# Patient Record
Sex: Female | Born: 1960 | Hispanic: No | Marital: Married | Smoking: Never smoker
Health system: Southern US, Community
[De-identification: ages and names within clinical notes are randomized; demographics above are authoritative.]

## PROBLEM LIST (undated history)

## (undated) DIAGNOSIS — C541 Malignant neoplasm of endometrium: Secondary | ICD-10-CM

## (undated) HISTORY — DX: Malignant neoplasm of endometrium: C54.1

---

## 2006-11-07 ENCOUNTER — Encounter: Admission: RE | Admit: 2006-11-07 | Discharge: 2006-11-07 | Payer: Self-pay | Admitting: Internal Medicine

## 2007-11-29 ENCOUNTER — Encounter: Admission: RE | Admit: 2007-11-29 | Discharge: 2007-11-29 | Payer: Self-pay | Admitting: Internal Medicine

## 2008-11-29 ENCOUNTER — Encounter: Admission: RE | Admit: 2008-11-29 | Discharge: 2008-11-29 | Payer: Self-pay | Admitting: Internal Medicine

## 2008-12-10 ENCOUNTER — Encounter: Admission: RE | Admit: 2008-12-10 | Discharge: 2008-12-10 | Payer: Self-pay | Admitting: Internal Medicine

## 2009-12-15 ENCOUNTER — Encounter: Admission: RE | Admit: 2009-12-15 | Discharge: 2009-12-15 | Payer: Self-pay | Admitting: Internal Medicine

## 2010-07-27 ENCOUNTER — Encounter: Payer: Self-pay | Admitting: Internal Medicine

## 2010-11-05 ENCOUNTER — Other Ambulatory Visit: Payer: Self-pay | Admitting: Internal Medicine

## 2010-11-05 DIAGNOSIS — Z1231 Encounter for screening mammogram for malignant neoplasm of breast: Secondary | ICD-10-CM

## 2010-12-17 ENCOUNTER — Ambulatory Visit
Admission: RE | Admit: 2010-12-17 | Discharge: 2010-12-17 | Disposition: A | Discharge status: Home / Self Care | Payer: 59 | Source: Ambulatory Visit | Attending: Internal Medicine | Admitting: Internal Medicine

## 2010-12-17 DIAGNOSIS — Z1231 Encounter for screening mammogram for malignant neoplasm of breast: Secondary | ICD-10-CM

## 2011-11-01 ENCOUNTER — Other Ambulatory Visit: Payer: Self-pay | Admitting: Internal Medicine

## 2011-11-01 DIAGNOSIS — Z1231 Encounter for screening mammogram for malignant neoplasm of breast: Secondary | ICD-10-CM

## 2011-12-20 ENCOUNTER — Ambulatory Visit
Admission: RE | Admit: 2011-12-20 | Discharge: 2011-12-20 | Disposition: A | Discharge status: Home / Self Care | Payer: 59 | Source: Ambulatory Visit | Attending: Internal Medicine | Admitting: Internal Medicine

## 2011-12-20 DIAGNOSIS — Z1231 Encounter for screening mammogram for malignant neoplasm of breast: Secondary | ICD-10-CM

## 2012-04-28 LAB — HM COLONOSCOPY

## 2012-11-13 ENCOUNTER — Other Ambulatory Visit: Payer: Self-pay

## 2012-11-13 DIAGNOSIS — Z1231 Encounter for screening mammogram for malignant neoplasm of breast: Secondary | ICD-10-CM

## 2012-12-25 ENCOUNTER — Ambulatory Visit
Admission: RE | Admit: 2012-12-25 | Discharge: 2012-12-25 | Disposition: A | Discharge status: Home / Self Care | Payer: Managed Care, Other (non HMO) | Source: Ambulatory Visit

## 2012-12-25 DIAGNOSIS — Z1231 Encounter for screening mammogram for malignant neoplasm of breast: Secondary | ICD-10-CM

## 2013-11-20 ENCOUNTER — Other Ambulatory Visit: Payer: Self-pay

## 2013-11-20 DIAGNOSIS — Z1231 Encounter for screening mammogram for malignant neoplasm of breast: Secondary | ICD-10-CM

## 2013-12-26 ENCOUNTER — Ambulatory Visit
Admission: RE | Admit: 2013-12-26 | Discharge: 2013-12-26 | Disposition: A | Discharge status: Home / Self Care | Payer: Managed Care, Other (non HMO) | Source: Ambulatory Visit

## 2013-12-26 DIAGNOSIS — Z1231 Encounter for screening mammogram for malignant neoplasm of breast: Secondary | ICD-10-CM

## 2014-11-25 ENCOUNTER — Other Ambulatory Visit: Payer: Self-pay

## 2014-11-25 DIAGNOSIS — Z1231 Encounter for screening mammogram for malignant neoplasm of breast: Secondary | ICD-10-CM

## 2014-12-30 ENCOUNTER — Ambulatory Visit
Admission: RE | Admit: 2014-12-30 | Discharge: 2014-12-30 | Disposition: A | Discharge status: Home / Self Care | Payer: BLUE CROSS/BLUE SHIELD | Source: Ambulatory Visit

## 2014-12-30 DIAGNOSIS — Z1231 Encounter for screening mammogram for malignant neoplasm of breast: Secondary | ICD-10-CM

## 2015-11-24 ENCOUNTER — Other Ambulatory Visit: Payer: Self-pay

## 2015-11-24 DIAGNOSIS — Z1231 Encounter for screening mammogram for malignant neoplasm of breast: Secondary | ICD-10-CM

## 2016-01-02 ENCOUNTER — Ambulatory Visit
Admission: RE | Admit: 2016-01-02 | Discharge: 2016-01-02 | Disposition: A | Discharge status: Home / Self Care | Payer: Managed Care, Other (non HMO) | Source: Ambulatory Visit

## 2016-01-02 DIAGNOSIS — Z1231 Encounter for screening mammogram for malignant neoplasm of breast: Secondary | ICD-10-CM

## 2016-05-24 ENCOUNTER — Ambulatory Visit: Payer: Managed Care, Other (non HMO) | Attending: Gynecology | Admitting: Gynecology

## 2016-05-24 ENCOUNTER — Encounter: Payer: Self-pay | Admitting: Gynecology

## 2016-05-24 VITALS — BP 103/64 | HR 66 | Temp 98.3°F | Resp 18 | Ht 59.0 in | Wt 128.4 lb

## 2016-05-24 DIAGNOSIS — C541 Malignant neoplasm of endometrium: Secondary | ICD-10-CM | POA: Diagnosis not present

## 2016-05-24 NOTE — Progress Notes (Signed)
Consult Note: Gyn-Onc   Mallory Hughes 55 y.o. female  Chief Complaint  Patient presents with  . endometrial adenocarcinoma    Assessment :Endometrial adenocarcinoma (grade 1)  Plan: I recommend the patient undergo robotic hysterectomy bilateral salpingo-oophorectomy and possible sentinel node lymphadenectomy. The risks and benefits of this surgery were discussed with the patient and her husband and her daughter (was on the phone) they're aware that postoperative radiation therapy or chemotherapy might be recommended based on our final pathology report. Further decision to perform a lymphadenectomy we based on intraoperative frozen section.  Surgical dates were operative the patient and they've elected to proceed with surgery on 06/15/2016 understand that Dr. Everitt Amber would be the primary surgeon. All questions are answered.  HPI: 55 year old married female seen in consultation at the request of Dr. Butler Denmark regarding management of a newly diagnosed endometrial cancer. The patient presented with postmenopausal bleeding and underwent ultrasound showing a 24 mm endometrial stripe. Endometrial biopsy was obtained on 05/14/2016 revealing an endometrial adenocarcinoma appearing to be well-differentiated (grade 1) the patient denies any pain or any other gynecologic history.  Obstetrical history gravida 1.  The patient has no significant medical comorbidities  Review of Systems:10 point review of systems is negative except as noted in interval history.   Vitals: Blood pressure 103/64, pulse 66, temperature 98.3 F (36.8 C), temperature source Oral, resp. rate 18, height 4\' 11"  (1.499 m), weight 128 lb 6.4 oz (58.2 kg), SpO2 100 %.  Physical Exam: General : The patient is a healthy woman in no acute distress.  HEENT: normocephalic, extraoccular movements normal; neck is supple without thyromegally  Lynphnodes: Supraclavicular and inguinal nodes not enlarged  Abdomen: Soft, non-tender, no  ascites, no organomegally, no masses, no hernias  Pelvic:  EGBUS: Normal female  Vagina: Normal, no lesions  Urethra and Bladder: Normal, non-tender  Cervix: Normal there is no bleeding today. Uterus: Anterior normal shape size and consistency. Bi-manual examination: Non-tender; no adenxal masses or nodularity  Rectal: normal sphincter tone, no masses, no blood  Lower extremities: No edema or varicosities. Normal range of motion      Not on File  History reviewed. No pertinent past medical history.  History reviewed. No pertinent surgical history.  Current Outpatient Prescriptions  Medication Sig Dispense Refill  . calcium-vitamin D 250-100 MG-UNIT tablet Take 1 tablet by mouth 2 (two) times daily.    . Multiple Vitamins-Minerals (MULTIVITAMIN ADULTS PO) Take by mouth.     No current facility-administered medications for this visit.     Social History   Social History  . Marital status: Unknown    Spouse name: N/A  . Number of children: N/A  . Years of education: N/A   Occupational History  . Not on file.   Social History Main Topics  . Smoking status: Never Smoker  . Smokeless tobacco: Never Used  . Alcohol use No  . Drug use: No  . Sexual activity: Yes   Other Topics Concern  . Not on file   Social History Narrative  . No narrative on file    History reviewed. No pertinent family history.    Marti Sleigh, MD 05/24/2016, 1:06 PM

## 2016-05-24 NOTE — Patient Instructions (Signed)
Preparing for your Surgery  Plan for surgery on December 12th with Dr. Everitt Amber  Pre-operative Testing -You will receive a phone call from presurgical testing at [facility] to arrange for a pre-operative testing appointment before your surgery.  This appointment normally occurs one to two weeks before your scheduled surgery.   -Bring your insurance card, copy of an advanced directive if applicable, medication list  -At that visit, you will be asked to sign a consent for a possible blood transfusion in case a transfusion becomes necessary during surgery.  The need for a blood transfusion is rare but having consent is a necessary part of your care.     -You should not be taking blood thinners or aspirin at least ten days prior to surgery unless instructed by your surgeon.  Day Before Surgery at [place] will be asked to take in a light diet the day before surgery.  Avoid carbonated beverages.  You will be advised to have nothing to eat or drink after midnight the evening before.     Eat a light diet the day before surgery.  Examples including soups, broths,  toast, yogurt, mashed potatoes.  Things to avoid include carbonated beverages  (fizzy beverages), raw fruits and raw vegetables, or beans.    If your bowels are filled with gas, your surgeon will have difficulty  visualizing your pelvic organs which increases your surgical risks.  Your role in recovery Your role is to become active as soon as directed by your doctor, while still giving yourself time to heal.  Rest when you feel tired. You will be asked to do the following in order to speed your recovery:  - Cough and breathe deeply. This helps toclear and expand your lungs and can prevent pneumonia. You may be given a spirometer to practice deep breathing. A staff member will show you how to use the spirometer. - Do mild physical activity. Walking or moving your legs help your circulation and body functions return  to normal. A staff member will help you when you try to walk and will provide you with simple exercises. Do not try to get up or walk alone the first time. - Actively manage your pain. Managing your pain lets you move in comfort. We will ask you to rate your pain on a scale of zero to 10. It is your responsibility to tell your doctor or nurse where and how much you hurt so your pain can be treated.  Special Considerations -If you are diabetic, you may be placed on insulin after surgery to have closer control over your blood sugars to promote healing and recovery.  This does not mean that you will be discharged on insulin.  If applicable, your oral antidiabetics will be resumed when you are tolerating a solid diet.  -Your final pathology results from surgery should be available by the Friday after surgery and the results will be relayed to you when available.   Blood Transfusion , Adult A blood transfusion is a procedure in which you receive donated blood, including plasma, platelets, and red blood cells, through an IV tube. You may need a blood transfusion because of illness, surgery, or injury. The blood may come from a donor. You may also be able to donate blood for yourself (autologous blood donation) before a surgery if you know that you might require a blood transfusion. The blood given in a transfusion is made up of different types of cells. You may receive:  Red  blood cells. These carry oxygen to the cells in the body.  White blood cells. These help you fight infections.  Platelets. These help your blood to clot.  Plasma. This is the liquid part of your blood and it helps with fluid imbalances. If you have hemophilia or another clotting disorder, you may also receive other types of blood products. Tell a health care provider about:  Any allergies you have.  All medicines you are taking, including vitamins, herbs, eye drops, creams, and over-the-counter medicines.  Any problems you  or family members have had with anesthetic medicines.  Any blood disorders you have.  Any surgeries you have had.  Any medical conditions you have, including any recent fever or cold symptoms.  Whether you are pregnant or may be pregnant.  Any previous reactions you have had during a blood transfusion. What are the risks? Generally, this is a safe procedure. However, problems may occur, including:  Having an allergic reaction to something in the donated blood. Hives and itching may be symptoms of this type of reaction.  Fever. This may be a reaction to the white blood cells in the transfused blood. Nausea or chest pain may accompany a fever.  Iron overload. This can happen from having many transfusions.  Transfusion-related acute lung injury (TRALI). This is a rare reaction that causes lung damage. The cause is not known.TRALI can occur within hours of a transfusion or several days later.  Sudden (acute) or delayed hemolytic reactions. This happens if your blood does not match the cells in your transfusion. Your body's defense system (immune system) may try to attack the new cells. This complication is rare. The symptoms include fever, chills, nausea, and low back pain or chest pain.  Infection or disease transmission. This is rare. What happens before the procedure?  You will have a blood test to determine your blood type. This is necessary to know what kind of blood your body will accept and to match it to the donor blood.  If you are going to have a planned surgery, you may be able to do an autologous blood donation. This may be done in case you need to have a transfusion.  If you have had an allergic reaction to a transfusion in the past, you may be given medicine to help prevent a reaction. This medicine may be given to you by mouth or through an IV tube.  You will have your temperature, blood pressure, and pulse monitored before the transfusion.  Follow instructions from  your health care provider about eating and drinking restrictions.  Ask your health care provider about:  Changing or stopping your regular medicines. This is especially important if you are taking diabetes medicines or blood thinners.  Taking medicines such as aspirin and ibuprofen. These medicines can thin your blood. Do not take these medicines before your procedure if your health care provider instructs you not to. What happens during the procedure?  An IV tube will be inserted into one of your veins.  The bag of donated blood will be attached to your IV tube. The blood will then enter through your vein.  Your temperature, blood pressure, and pulse will be monitored regularly during the transfusion. This monitoring is done to detect early signs of a transfusion reaction.  If you have any signs or symptoms of a reaction, your transfusion will be stopped and you may be given medicine.  When the transfusion is complete, your IV tube will be removed.  Pressure may  be applied to the IV site for a few minutes.  A bandage (dressing) will be applied. The procedure may vary among health care providers and hospitals. What happens after the procedure?  Your temperature, blood pressure, heart rate, breathing rate, and blood oxygen level will be monitored often.  Your blood may be tested to see how you are responding to the transfusion.  You may be warmed with fluids or blankets to maintain a normal body temperature. Summary  A blood transfusion is a procedure in which you receive donated blood, including plasma, platelets, and red blood cells, through an IV tube.  Your temperature, blood pressure, and pulse will be monitored before, during, and after the transfusion.  Your blood may be tested after the transfusion to see how your body has responded. This information is not intended to replace advice given to you by your health care provider. Make sure you discuss any questions you have  with your health care provider. Document Released: 06/18/2000 Document Revised: 03/18/2016 Document Reviewed: 03/18/2016 Elsevier Interactive Patient Education  2017 [employer].

## 2016-05-25 NOTE — Progress Notes (Signed)
Scheduling pre op appt-  Please PLACE SURGICAL ORDERS IN EPIC  thanks

## 2016-06-08 NOTE — Patient Instructions (Addendum)
Mallory Hughes  06/08/2016   Your procedure is scheduled on: 06-15-16  Report to [facility]  Entrance take [facility]  elevators to 3rd floor to  Mallory Hughes at 1145 AM.  Call this number if you have problems the morning of surgery [phone]   Remember: ONLY 1 PERSON MAY GO WITH YOU TO SHORT STAY TO GET  READY MORNING OF Mallory Hughes.  Do not eat food  :After Midnight. You may have clear liquids from midnight night 7:45 am day of surgery. Then nothing by mouth after 7:45 am.    EAT A LIGHT DIET DAY BEFORE SURGERY 06-14-16 SEE INSTRUCTIONS BELOW   Take these medicines the morning of surgery with A SIP OF WATER: none              You may not have any metal on your body including hair pins and              piercings  Do not wear jewelry, make-up, lotions, powders or perfumes, deodorant             Do not wear nail polish.  Do not shave  48 hours prior to surgery.              Men may shave face and neck.   Do not bring valuables to the hospital. Mallory Hughes.  Contacts, dentures or bridgework may not be worn into surgery.  Leave suitcase in the car. After surgery it may be brought to your room.                  Please read over the following fact sheets you were given: _____________________________________________________________________             Eat a light diet the day before surgery.  Examples including soups, broths, toast, yogurt, mashed potatoes.  Things to avoid include carbonated beverages (fizzy beverages), raw fruits and raw vegetables, or beans.   If your bowels are filled with gas, your surgeon will have difficulty visualizing your pelvic organs which increases your surgical risks.   CLEAR LIQUID DIET ( FROM MIDNIGHT UNTIL 7:45 AM DAY OF SURGERY, THEN NOTHING BY MOUTH AFTER 7:45 AM DAY OF SURGERY)   Foods Allowed                                                                      Foods Excluded  Coffee and tea, regular and decaf                             liquids that you cannot  Plain Jell-O in any flavor                                             see through such as: Fruit ices (not with fruit pulp)  milk, soups, orange juice  Iced Popsicles                                    All solid food Carbonated beverages, regular and diet                                    Cranberry, grape and apple juices Sports drinks like Gatorade Lightly seasoned clear broth or consume(fat free) Sugar, honey syrup  Sample Menu Breakfast                                Lunch                                     Supper Cranberry juice                    Beef broth                            Chicken broth Jell-O                                     Grape juice                           Apple juice Coffee or tea                        Jell-O                                      Popsicle                                                Coffee or tea                        Coffee or tea  _____________________________________________________________________  [facility] - Preparing for Surgery Before surgery, you can play an important role.  Because skin is not sterile, your skin needs to be as free of germs as possible.  You can reduce the number of germs on your skin by washing with CHG (chlorahexidine gluconate) soap before surgery.  CHG is an antiseptic cleaner which kills germs and bonds with the skin to continue killing germs even after washing. Please DO NOT use if you have an allergy to CHG or antibacterial soaps.  If your skin becomes reddened/irritated stop using the CHG and inform your nurse when you arrive at Short Stay. Do not shave (including legs and underarms) for at least 48 hours prior to the first CHG shower.  You may shave your face/neck. Please follow these instructions carefully:  1.  Shower with CHG Soap the night before surgery and the   morning of Surgery.  2.  If you choose to  wash your hair, wash your hair first as usual with your  normal  shampoo.  3.  After you shampoo, rinse your hair and body thoroughly to remove the  shampoo.                           4.  Use CHG as you would any other liquid soap.  You can apply chg directly  to the skin and wash                       Gently with a scrungie or clean washcloth.  5.  Apply the CHG Soap to your body ONLY FROM THE NECK DOWN.   Do not use on face/ open                           Wound or open sores. Avoid contact with eyes, ears mouth and genitals (private parts).                       Wash face,  Genitals (private parts) with your normal soap.             6.  Wash thoroughly, paying special attention to the area where your surgery  will be performed.  7.  Thoroughly rinse your body with warm water from the neck down.  8.  DO NOT shower/wash with your normal soap after using and rinsing off  the CHG Soap.                9.  Pat yourself dry with a clean towel.            10.  Wear clean pajamas.            11.  Place clean sheets on your bed the night of your first shower and do not  sleep with pets. Day of Surgery : Do not apply any lotions/deodorants the morning of surgery.  Please wear clean clothes to the hospital/surgery center.  FAILURE TO FOLLOW THESE INSTRUCTIONS MAY RESULT IN THE CANCELLATION OF YOUR SURGERY PATIENT SIGNATURE_________________________________  NURSE SIGNATURE__________________________________  ________________________________________________________________________   Mallory Hughes  An incentive spirometer is a tool that can help keep your lungs clear and active. This tool measures how well you are filling your lungs with each breath. Taking long deep breaths may help reverse or decrease the chance of developing breathing (pulmonary) problems (especially infection) following:  A long period of time when you are unable to move or be  active. BEFORE THE PROCEDURE   If the spirometer includes an indicator to show your best effort, your nurse or respiratory therapist will set it to a desired goal.  If possible, sit up straight or lean slightly forward. Try not to slouch.  Hold the incentive spirometer in an upright position. INSTRUCTIONS FOR USE  1. Sit on the edge of your bed if possible, or sit up as far as you can in bed or on a chair. 2. Hold the incentive spirometer in an upright position. 3. Breathe out normally. 4. Place the mouthpiece in your mouth and seal your lips tightly around it. 5. Breathe in slowly and as deeply as possible, raising the piston or the ball toward the top of the column. 6. Hold your breath for 3-5 seconds or for as long as possible. Allow the piston or ball to fall to the bottom  of the column. 7. Remove the mouthpiece from your mouth and breathe out normally. 8. Rest for a few seconds and repeat Steps 1 through 7 at least 10 times every 1-2 hours when you are awake. Take your time and take a few normal breaths between deep breaths. 9. The spirometer may include an indicator to show your best effort. Use the indicator as a goal to work toward during each repetition. 10. After each set of 10 deep breaths, practice coughing to be sure your lungs are clear. If you have an incision (the cut made at the time of surgery), support your incision when coughing by placing a pillow or rolled up towels firmly against it. Once you are able to get out of bed, walk around indoors and cough well. You may stop using the incentive spirometer when instructed by your caregiver.  RISKS AND COMPLICATIONS  Take your time so you do not get dizzy or light-headed.  If you are in pain, you may need to take or ask for pain medication before doing incentive spirometry. It is harder to take a deep breath if you are having pain. AFTER USE  Rest and breathe slowly and easily.  It can be helpful to keep track of a log of  your progress. Your caregiver can provide you with a simple table to help with this. If you are using the spirometer at home, follow these instructions: [place] IF:   You are having difficultly using the spirometer.  You have trouble using the spirometer as often as instructed.  Your pain medication is not giving enough relief while using the spirometer.  You develop fever of 100.5 F (38.1 C) or higher. SEEK IMMEDIATE MEDICAL CARE IF:   You cough up bloody sputum that had not been present before.  You develop fever of 102 F (38.9 C) or greater.  You develop worsening pain at or near the incision site. MAKE SURE YOU:   Understand these instructions.  Will watch your condition.  Will get help right away if you are not doing well or get worse. Document Released: 11/01/2006 Document Revised: 09/13/2011 Document Reviewed: 01/02/2007 ExitCare Patient Information 2014 Exit[employer], [place].   ________________________________________________________________________  WHAT IS A BLOOD TRANSFUSION? Blood Transfusion Information  A transfusion is the replacement of blood or some of its parts. Blood is made up of multiple cells which provide different functions.  Red blood cells carry oxygen and are used for blood loss replacement.  White blood cells fight against infection.  Platelets control bleeding.  Plasma helps clot blood.  Other blood products are available for specialized needs, such as hemophilia or other clotting disorders. BEFORE THE TRANSFUSION  Who gives blood for transfusions?   Healthy volunteers who are fully evaluated to make sure their blood is safe. This is blood bank blood. Transfusion therapy is the safest it has ever been in the practice of medicine. Before blood is taken from a donor, a complete history is taken to make sure that person has no history of diseases nor engages in risky social behavior (examples are intravenous drug use or sexual activity  with multiple partners). The donor's travel history is screened to minimize risk of transmitting infections, such as malaria. The donated blood is tested for signs of infectious diseases, such as HIV and hepatitis. The blood is then tested to be sure it is compatible with you in order to minimize the chance of a transfusion reaction. If you or a relative donates blood, this is  often done in anticipation of surgery and is not appropriate for emergency situations. It takes many days to process the donated blood. RISKS AND COMPLICATIONS Although transfusion therapy is very safe and saves many lives, the main dangers of transfusion include:   Getting an infectious disease.  Developing a transfusion reaction. This is an allergic reaction to something in the blood you were given. Every precaution is taken to prevent this. The decision to have a blood transfusion has been considered carefully by your caregiver before blood is given. Blood is not given unless the benefits outweigh the risks. AFTER THE TRANSFUSION  Right after receiving a blood transfusion, you will usually feel much better and more energetic. This is especially true if your red blood cells have gotten low (anemic). The transfusion raises the level of the red blood cells which carry oxygen, and this usually causes an energy increase.  The nurse administering the transfusion will monitor you carefully for complications. HOME CARE INSTRUCTIONS  No special instructions are needed after a transfusion. You may find your energy is better. Speak with your caregiver about any limitations on activity for underlying diseases you may have. SEEK MEDICAL CARE IF:   Your condition is not improving after your transfusion.  You develop redness or irritation at the intravenous (IV) site. SEEK IMMEDIATE MEDICAL CARE IF:  Any of the following symptoms occur over the next 12 hours:  Shaking chills.  You have a temperature by mouth above 102 F (38.9  C), not controlled by medicine.  Chest, back, or muscle pain.  People around you feel you are not acting correctly or are confused.  Shortness of breath or difficulty breathing.  Dizziness and fainting.  You get a rash or develop hives.  You have a decrease in urine output.  Your urine turns a dark color or changes to pink, red, or brown. Any of the following symptoms occur over the next 10 days:  You have a temperature by mouth above 102 F (38.9 C), not controlled by medicine.  Shortness of breath.  Weakness after normal activity.  The white part of the eye turns yellow (jaundice).  You have a decrease in the amount of urine or are urinating less often.  Your urine turns a dark color or changes to pink, red, or brown. Document Released: 06/18/2000 Document Revised: 09/13/2011 Document Reviewed: 02/05/2008 [facility] Patient Information 2014 [place], [place].  _______________________________________________________________________

## 2016-06-11 ENCOUNTER — Ambulatory Visit (HOSPITAL_COMMUNITY)
Admission: RE | Admit: 2016-06-11 | Discharge: 2016-06-11 | Disposition: A | Discharge status: Home / Self Care | Payer: Managed Care, Other (non HMO) | Source: Ambulatory Visit | Attending: Gynecologic Oncology | Admitting: Gynecologic Oncology

## 2016-06-11 ENCOUNTER — Encounter (HOSPITAL_COMMUNITY): Payer: Self-pay

## 2016-06-11 ENCOUNTER — Encounter (HOSPITAL_COMMUNITY)
Admission: RE | Admit: 2016-06-11 | Discharge: 2016-06-11 | Disposition: A | Discharge status: Home / Self Care | Payer: Managed Care, Other (non HMO) | Source: Ambulatory Visit | Attending: Gynecologic Oncology | Admitting: Gynecologic Oncology

## 2016-06-11 DIAGNOSIS — C541 Malignant neoplasm of endometrium: Secondary | ICD-10-CM | POA: Diagnosis present

## 2016-06-11 DIAGNOSIS — Z01812 Encounter for preprocedural laboratory examination: Secondary | ICD-10-CM | POA: Diagnosis present

## 2016-06-11 DIAGNOSIS — Z01818 Encounter for other preprocedural examination: Secondary | ICD-10-CM | POA: Insufficient documentation

## 2016-06-11 LAB — HCG, SERUM, QUALITATIVE: Preg, Serum: NEGATIVE

## 2016-06-11 LAB — COMPREHENSIVE METABOLIC PANEL
ALT: 16 U/L (ref 14–54)
ANION GAP: 4 — AB (ref 5–15)
AST: 21 U/L (ref 15–41)
Albumin: 4.5 g/dL (ref 3.5–5.0)
Alkaline Phosphatase: 65 U/L (ref 38–126)
BUN: 6 mg/dL (ref 6–20)
CHLORIDE: 105 mmol/L (ref 101–111)
CO2: 29 mmol/L (ref 22–32)
CREATININE: 0.62 mg/dL (ref 0.44–1.00)
Calcium: 9.5 mg/dL (ref 8.9–10.3)
GFR calc non Af Amer: >60 mL/min (ref >60)
Glucose, Bld: 120 mg/dL — ABNORMAL HIGH (ref 65–99)
Potassium: 3.7 mmol/L (ref 3.5–5.1)
SODIUM: 138 mmol/L (ref 135–145)
Total Bilirubin: 0.5 mg/dL (ref 0.3–1.2)
Total Protein: 7.8 g/dL (ref 6.5–8.1)

## 2016-06-11 LAB — CBC WITH DIFFERENTIAL/PLATELET
BASOS PCT: 1 %
Basophils Absolute: 0 10*3/uL (ref 0.0–0.1)
EOS ABS: 0.4 10*3/uL (ref 0.0–0.7)
EOS PCT: 6 %
HCT: 37 % (ref 36.0–46.0)
Hemoglobin: 13.2 g/dL (ref 12.0–15.0)
LYMPHS ABS: 2.4 10*3/uL (ref 0.7–4.0)
Lymphocytes Relative: 37 %
MCH: 30.2 pg (ref 26.0–34.0)
MCHC: 35.7 g/dL (ref 30.0–36.0)
MCV: 84.7 fL (ref 78.0–100.0)
Monocytes Absolute: 0.4 10*3/uL (ref 0.1–1.0)
Monocytes Relative: 6 %
Neutro Abs: 3.4 10*3/uL (ref 1.7–7.7)
Neutrophils Relative %: 52 %
PLATELETS: 226 10*3/uL (ref 150–400)
RBC: 4.37 MIL/uL (ref 3.87–5.11)
RDW: 12.3 % (ref 11.5–15.5)
WBC: 6.6 10*3/uL (ref 4.0–10.5)

## 2016-06-11 LAB — URINALYSIS, ROUTINE W REFLEX MICROSCOPIC
BILIRUBIN URINE: NEGATIVE
Bacteria, UA: NONE SEEN
Glucose, UA: NEGATIVE mg/dL
KETONES UR: NEGATIVE mg/dL
NITRITE: NEGATIVE
PROTEIN: NEGATIVE mg/dL
RBC / HPF: NONE SEEN RBC/hpf (ref 0–5)
SPECIFIC GRAVITY, URINE: 1.002 — AB (ref 1.005–1.030)
Squamous Epithelial / LPF: NONE SEEN
pH: 7 (ref 5.0–8.0)

## 2016-06-12 LAB — ABO/RH: ABO/RH(D): B POS

## 2016-06-15 ENCOUNTER — Ambulatory Visit (HOSPITAL_COMMUNITY)
Admission: RE | Admit: 2016-06-15 | Discharge: 2016-06-16 | Disposition: A | Discharge status: Home / Self Care | Payer: Managed Care, Other (non HMO) | Source: Ambulatory Visit | Attending: Gynecologic Oncology | Admitting: Gynecologic Oncology

## 2016-06-15 ENCOUNTER — Encounter (HOSPITAL_COMMUNITY)
Admission: RE | Disposition: A | Discharge status: Home / Self Care | Payer: Self-pay | Source: Ambulatory Visit | Attending: Gynecologic Oncology

## 2016-06-15 ENCOUNTER — Ambulatory Visit (HOSPITAL_COMMUNITY): Payer: Managed Care, Other (non HMO) | Admitting: Anesthesiology

## 2016-06-15 ENCOUNTER — Encounter (HOSPITAL_COMMUNITY): Payer: Self-pay | Admitting: *Deleted

## 2016-06-15 DIAGNOSIS — D251 Intramural leiomyoma of uterus: Secondary | ICD-10-CM | POA: Diagnosis not present

## 2016-06-15 DIAGNOSIS — C541 Malignant neoplasm of endometrium: Secondary | ICD-10-CM | POA: Diagnosis present

## 2016-06-15 DIAGNOSIS — N888 Other specified noninflammatory disorders of cervix uteri: Secondary | ICD-10-CM | POA: Insufficient documentation

## 2016-06-15 DIAGNOSIS — N7091 Salpingitis, unspecified: Secondary | ICD-10-CM | POA: Diagnosis not present

## 2016-06-15 HISTORY — PX: LYMPH NODE DISSECTION: SHX5087

## 2016-06-15 HISTORY — PX: ROBOTIC ASSISTED SALPINGO OOPHERECTOMY: SHX6082

## 2016-06-15 HISTORY — PX: ROBOTIC ASSISTED LAP VAGINAL HYSTERECTOMY: SHX2362

## 2016-06-15 LAB — TYPE AND SCREEN
ABO/RH(D): B POS
Antibody Screen: NEGATIVE

## 2016-06-15 SURGERY — HYSTERECTOMY, VAGINAL, ROBOT-ASSISTED
Anesthesia: General | Site: Abdomen

## 2016-06-15 MED ORDER — ENOXAPARIN SODIUM 40 MG/0.4ML ~~LOC~~ SOLN
40.0000 mg | SUBCUTANEOUS | Status: DC
  Administered 2016-06-16: 40 mg via SUBCUTANEOUS
  Filled 2016-06-15: qty 0.4

## 2016-06-15 MED ORDER — LACTATED RINGERS IV SOLN: INTRAVENOUS | Status: DC

## 2016-06-15 MED ORDER — FENTANYL CITRATE (PF) 250 MCG/5ML IJ SOLN
INTRAMUSCULAR | Status: AC
  Filled 2016-06-15: qty 5

## 2016-06-15 MED ORDER — SUGAMMADEX SODIUM 200 MG/2ML IV SOLN
INTRAVENOUS | Status: DC | PRN
  Administered 2016-06-15: 200 mg via INTRAVENOUS

## 2016-06-15 MED ORDER — LACTATED RINGERS IR SOLN
Status: DC | PRN
  Administered 2016-06-15: 3000 mL

## 2016-06-15 MED ORDER — MEPERIDINE HCL 50 MG/ML IJ SOLN: 6.2500 mg | INTRAMUSCULAR | Status: DC | PRN

## 2016-06-15 MED ORDER — HYDROMORPHONE HCL 2 MG/ML IJ SOLN
INTRAMUSCULAR | Status: AC
  Filled 2016-06-15: qty 1

## 2016-06-15 MED ORDER — CEFAZOLIN SODIUM-DEXTROSE 2-4 GM/100ML-% IV SOLN
2.0000 g | INTRAVENOUS | Status: AC
  Administered 2016-06-15: 2 g via INTRAVENOUS

## 2016-06-15 MED ORDER — ONDANSETRON HCL 4 MG/2ML IJ SOLN
INTRAMUSCULAR | Status: AC
  Filled 2016-06-15: qty 2

## 2016-06-15 MED ORDER — LIDOCAINE 2% (20 MG/ML) 5 ML SYRINGE
INTRAMUSCULAR | Status: DC | PRN
  Administered 2016-06-15: 100 mg via INTRAVENOUS

## 2016-06-15 MED ORDER — KETOROLAC TROMETHAMINE 15 MG/ML IJ SOLN
INTRAMUSCULAR | Status: AC
  Administered 2016-06-15: 15 mg via INTRAVENOUS
  Filled 2016-06-15: qty 1

## 2016-06-15 MED ORDER — SUGAMMADEX SODIUM 200 MG/2ML IV SOLN
INTRAVENOUS | Status: AC
  Filled 2016-06-15: qty 2

## 2016-06-15 MED ORDER — HYDROMORPHONE HCL 2 MG/ML IJ SOLN
0.2500 mg | INTRAMUSCULAR | Status: DC | PRN
  Administered 2016-06-15: 0.5 mg via INTRAVENOUS

## 2016-06-15 MED ORDER — DEXAMETHASONE SODIUM PHOSPHATE 10 MG/ML IJ SOLN
INTRAMUSCULAR | Status: AC
  Filled 2016-06-15: qty 1

## 2016-06-15 MED ORDER — SUCCINYLCHOLINE CHLORIDE 200 MG/10ML IV SOSY
PREFILLED_SYRINGE | INTRAVENOUS | Status: DC | PRN
  Administered 2016-06-15: 100 mg via INTRAVENOUS

## 2016-06-15 MED ORDER — MIDAZOLAM HCL 5 MG/5ML IJ SOLN
INTRAMUSCULAR | Status: DC | PRN
  Administered 2016-06-15: 2 mg via INTRAVENOUS

## 2016-06-15 MED ORDER — PROPOFOL 10 MG/ML IV BOLUS
INTRAVENOUS | Status: AC
  Filled 2016-06-15: qty 20

## 2016-06-15 MED ORDER — GABAPENTIN 300 MG PO CAPS
600.0000 mg | ORAL_CAPSULE | Freq: Every day | ORAL | Status: AC
  Administered 2016-06-15: 600 mg via ORAL
  Filled 2016-06-15: qty 2

## 2016-06-15 MED ORDER — OXYCODONE-ACETAMINOPHEN 5-325 MG PO TABS
1.0000 | ORAL_TABLET | ORAL | Status: DC | PRN
  Administered 2016-06-15 – 2016-06-16 (×2): 1 via ORAL
  Filled 2016-06-15 (×2): qty 1

## 2016-06-15 MED ORDER — ONDANSETRON HCL 4 MG/2ML IJ SOLN: 4.0000 mg | Freq: Four times a day (QID) | INTRAMUSCULAR | Status: DC | PRN

## 2016-06-15 MED ORDER — FENTANYL CITRATE (PF) 100 MCG/2ML IJ SOLN
INTRAMUSCULAR | Status: DC | PRN
  Administered 2016-06-15 (×3): 50 ug via INTRAVENOUS
  Administered 2016-06-15: 100 ug via INTRAVENOUS

## 2016-06-15 MED ORDER — PHENYLEPHRINE HCL 10 MG/ML IJ SOLN
INTRAMUSCULAR | Status: DC | PRN
  Administered 2016-06-15: 80 ug via INTRAVENOUS

## 2016-06-15 MED ORDER — CEFAZOLIN SODIUM-DEXTROSE 2-4 GM/100ML-% IV SOLN
INTRAVENOUS | Status: AC
  Filled 2016-06-15: qty 100

## 2016-06-15 MED ORDER — ONDANSETRON HCL 4 MG/2ML IJ SOLN
INTRAMUSCULAR | Status: DC | PRN
  Administered 2016-06-15: 4 mg via INTRAVENOUS

## 2016-06-15 MED ORDER — KCL IN DEXTROSE-NACL 20-5-0.45 MEQ/L-%-% IV SOLN
INTRAVENOUS | Status: DC
  Administered 2016-06-15 – 2016-06-16 (×2): via INTRAVENOUS
  Filled 2016-06-15 (×3): qty 1000

## 2016-06-15 MED ORDER — LACTATED RINGERS IV SOLN
INTRAVENOUS | Status: DC
  Administered 2016-06-15: 17:00:00 via INTRAVENOUS
  Administered 2016-06-15: 1000 mL via INTRAVENOUS

## 2016-06-15 MED ORDER — ENOXAPARIN SODIUM 40 MG/0.4ML ~~LOC~~ SOLN
40.0000 mg | SUBCUTANEOUS | Status: AC
  Administered 2016-06-15: 40 mg via SUBCUTANEOUS
  Filled 2016-06-15: qty 0.4

## 2016-06-15 MED ORDER — MIDAZOLAM HCL 2 MG/2ML IJ SOLN
INTRAMUSCULAR | Status: AC
  Filled 2016-06-15: qty 2

## 2016-06-15 MED ORDER — DEXAMETHASONE SODIUM PHOSPHATE 10 MG/ML IJ SOLN
INTRAMUSCULAR | Status: DC | PRN
Start: 2016-06-15 — End: 2016-06-15
  Administered 2016-06-15: 10 mg via INTRAVENOUS

## 2016-06-15 MED ORDER — PROMETHAZINE HCL 25 MG/ML IJ SOLN: 6.2500 mg | INTRAMUSCULAR | Status: DC | PRN

## 2016-06-15 MED ORDER — ONDANSETRON HCL 4 MG PO TABS
4.0000 mg | ORAL_TABLET | Freq: Four times a day (QID) | ORAL | Status: DC | PRN
  Administered 2016-06-16: 4 mg via ORAL
  Filled 2016-06-15: qty 1

## 2016-06-15 MED ORDER — PROPOFOL 10 MG/ML IV BOLUS
INTRAVENOUS | Status: DC | PRN
  Administered 2016-06-15: 100 mg via INTRAVENOUS

## 2016-06-15 MED ORDER — KETOROLAC TROMETHAMINE 15 MG/ML IJ SOLN
15.0000 mg | Freq: Four times a day (QID) | INTRAMUSCULAR | Status: DC
  Administered 2016-06-15 – 2016-06-16 (×2): 15 mg via INTRAVENOUS
  Filled 2016-06-15 (×2): qty 1

## 2016-06-15 MED ORDER — HYDROMORPHONE HCL 2 MG/ML IJ SOLN: 0.2000 mg | INTRAMUSCULAR | Status: DC | PRN

## 2016-06-15 MED ORDER — ROCURONIUM BROMIDE 10 MG/ML (PF) SYRINGE
PREFILLED_SYRINGE | INTRAVENOUS | Status: DC | PRN
  Administered 2016-06-15: 40 mg via INTRAVENOUS

## 2016-06-15 MED ORDER — IBUPROFEN 800 MG PO TABS: 800.0000 mg | ORAL_TABLET | Freq: Three times a day (TID) | ORAL | Status: DC | PRN

## 2016-06-15 SURGICAL SUPPLY — 65 items
ADH SKN CLS APL DERMABOND .7 (GAUZE/BANDAGES/DRESSINGS) ×2
APL ESCP 34 STRL LF DISP (HEMOSTASIS)
APPLICATOR SURGIFLO ENDO (HEMOSTASIS) IMPLANT
BAG LAPAROSCOPIC 12 15 PORT 16 (BASKET) IMPLANT
BAG RETRIEVAL 12/15 (BASKET)
BAG SPEC RTRVL LRG 6X4 10 (ENDOMECHANICALS)
CHLORAPREP W/TINT 26ML (MISCELLANEOUS) ×3 IMPLANT
COVER SURGICAL LIGHT HANDLE (MISCELLANEOUS) ×3 IMPLANT
COVER TIP SHEARS 8 DVNC (MISCELLANEOUS) ×2 IMPLANT
COVER TIP SHEARS 8MM DA VINCI (MISCELLANEOUS) ×1
DERMABOND ADVANCED (GAUZE/BANDAGES/DRESSINGS) ×1
DERMABOND ADVANCED .7 DNX12 (GAUZE/BANDAGES/DRESSINGS) ×2 IMPLANT
DRAPE ARM DVNC X/XI (DISPOSABLE) ×8 IMPLANT
DRAPE COLUMN DVNC XI (DISPOSABLE) ×2 IMPLANT
DRAPE DA VINCI XI ARM (DISPOSABLE) ×4
DRAPE DA VINCI XI COLUMN (DISPOSABLE) ×1
DRAPE SHEET LG 3/4 BI-LAMINATE (DRAPES) ×6 IMPLANT
DRAPE SURG IRRIG POUCH 19X23 (DRAPES) ×3 IMPLANT
ELECT REM PT RETURN 15FT ADLT (MISCELLANEOUS) ×3 IMPLANT
ELECT REM PT RETURN 9FT ADLT (ELECTROSURGICAL) ×3
ELECTRODE REM PT RTRN 9FT ADLT (ELECTROSURGICAL) ×2 IMPLANT
GLOVE BIO SURGEON STRL SZ 6 (GLOVE) ×12 IMPLANT
GLOVE BIO SURGEON STRL SZ 6.5 (GLOVE) ×6 IMPLANT
GOWN STRL REUS W/ TWL LRG LVL3 (GOWN DISPOSABLE) ×6 IMPLANT
GOWN STRL REUS W/TWL LRG LVL3 (GOWN DISPOSABLE) ×9
HOLDER FOLEY CATH W/STRAP (MISCELLANEOUS) ×3 IMPLANT
IRRIG SUCT STRYKERFLOW 2 WTIP (MISCELLANEOUS) ×3
IRRIGATION SUCT STRKRFLW 2 WTP (MISCELLANEOUS) ×2 IMPLANT
KIT BASIN OR (CUSTOM PROCEDURE TRAY) ×3 IMPLANT
KIT PROCEDURE DA VINCI SI (MISCELLANEOUS) ×1
KIT PROCEDURE DVNC SI (MISCELLANEOUS) IMPLANT
MANIPULATOR UTERINE 4.5 ZUMI (MISCELLANEOUS) ×3 IMPLANT
MARKER SKIN DUAL TIP RULER LAB (MISCELLANEOUS) ×3 IMPLANT
NDL SAFETY ECLIPSE 18X1.5 (NEEDLE) ×2 IMPLANT
NDL SPNL 18GX3.5 QUINCKE PK (NEEDLE) ×2 IMPLANT
NEEDLE HYPO 18GX1.5 SHARP (NEEDLE) ×3
NEEDLE SPNL 18GX3.5 QUINCKE PK (NEEDLE) ×3 IMPLANT
OBTURATOR OPTICAL STANDARD 8MM (TROCAR) ×1
OBTURATOR OPTICAL STND 8 DVNC (TROCAR) ×2
OBTURATOR OPTICALSTD 8 DVNC (TROCAR) ×2 IMPLANT
OCCLUDER COLPOPNEUMO (BALLOONS) ×3 IMPLANT
PAD POSITIONING PINK XL (MISCELLANEOUS) ×3 IMPLANT
PORT ACCESS TROCAR AIRSEAL 12 (TROCAR) ×2 IMPLANT
PORT ACCESS TROCAR AIRSEAL 5M (TROCAR) ×1
POUCH SPECIMEN RETRIEVAL 10MM (ENDOMECHANICALS) IMPLANT
SEAL CANN UNIV 5-8 DVNC XI (MISCELLANEOUS) ×8 IMPLANT
SEAL XI 5MM-8MM UNIVERSAL (MISCELLANEOUS) ×4
SET TRI-LUMEN FLTR TB AIRSEAL (TUBING) ×3 IMPLANT
SHEET LAVH (DRAPES) ×3 IMPLANT
SOLUTION ELECTROLUBE (MISCELLANEOUS) ×3 IMPLANT
SURGIFLO W/THROMBIN 8M KIT (HEMOSTASIS) IMPLANT
SUT MNCRL AB 4-0 PS2 18 (SUTURE) ×6 IMPLANT
SUT VIC AB 0 CT1 27 (SUTURE) ×6
SUT VIC AB 0 CT1 27XBRD ANTBC (SUTURE) ×2 IMPLANT
SYR 10ML LL (SYRINGE) ×3 IMPLANT
SYR 50ML LL SCALE MARK (SYRINGE) ×3 IMPLANT
TOWEL OR 17X26 10 PK STRL BLUE (TOWEL DISPOSABLE) ×6 IMPLANT
TOWEL OR NON WOVEN STRL DISP B (DISPOSABLE) ×3 IMPLANT
TRAP SPECIMEN MUCOUS 40CC (MISCELLANEOUS) IMPLANT
TRAY FOLEY W/METER SILVER 16FR (SET/KITS/TRAYS/PACK) ×3 IMPLANT
TRAY LAPAROSCOPIC (CUSTOM PROCEDURE TRAY) ×3 IMPLANT
TROCAR BLADELESS OPT 5 100 (ENDOMECHANICALS) ×3 IMPLANT
UNDERPAD 30X30 (UNDERPADS AND DIAPERS) ×3 IMPLANT
UNDERPAD 30X30 INCONTINENT (UNDERPADS AND DIAPERS) ×3 IMPLANT
WATER STERILE IRR 1500ML POUR (IV SOLUTION) ×6 IMPLANT

## 2016-06-15 NOTE — Anesthesia Postprocedure Evaluation (Signed)
Anesthesia Post Note  Patient: Mallory Hughes  Procedure(s) Performed: Procedure(s) (LRB): XI ROBOTIC ASSISTED LAPAROSCOPIC VAGINAL HYSTERECTOMY (N/A) XI ROBOTIC ASSISTED BILATERAL SALPINGO OOPHORECTOMY (Bilateral) BILATERAL SENTINAL LYMPH NODE DISSECTION (N/A)  Patient location during evaluation: PACU Anesthesia Type: General Level of consciousness: awake and alert, oriented and patient cooperative Pain management: pain level controlled Vital Signs Assessment: post-procedure vital signs reviewed and stable Respiratory status: spontaneous breathing, nonlabored ventilation, respiratory function stable and patient connected to nasal cannula oxygen Cardiovascular status: blood pressure returned to baseline and stable Postop Assessment: no signs of nausea or vomiting Anesthetic complications: no    Last Vitals:  Vitals:   06/15/16 1830 06/15/16 1844  BP: (!) 102/51 (!) 109/51  Pulse: 70 65  Resp: 15 14  Temp: 36.5 C 36.6 C    Last Pain:  Vitals:   06/15/16 1830  TempSrc:   PainSc: Asleep                 Banita Lehn,E. Jayana Kotula

## 2016-06-15 NOTE — Anesthesia Procedure Notes (Signed)
Procedure Name: Intubation Date/Time: 06/15/2016 3:56 PM Performed by: Lind Covert Pre-anesthesia Checklist: Patient identified, Emergency Drugs available, Suction available, Patient being monitored and Timeout performed Patient Re-evaluated:Patient Re-evaluated prior to inductionOxygen Delivery Method: Circle system utilized Preoxygenation: Pre-oxygenation with 100% oxygen Intubation Type: IV induction Laryngoscope Size: Mac and 3 Grade View: Grade I Tube type: Oral Tube size: 7.0 mm Airway Equipment and Method: Stylet Placement Confirmation: ETT inserted through vocal cords under direct vision,  positive ETCO2 and breath sounds checked- equal and bilateral Secured at: 19 cm Tube secured with: Tape Dental Injury: Teeth and Oropharynx as per pre-operative assessment

## 2016-06-15 NOTE — Progress Notes (Signed)
OR delay spoke with family.

## 2016-06-15 NOTE — H&P (View-Only) (Signed)
Consult Note: Gyn-Onc   Mallory Hughes 55 y.o. female  Chief Complaint  Patient presents with  . endometrial adenocarcinoma    Assessment :Endometrial adenocarcinoma (grade 1)  Plan: I recommend the patient undergo robotic hysterectomy bilateral salpingo-oophorectomy and possible sentinel node lymphadenectomy. The risks and benefits of this surgery were discussed with the patient and her husband and her daughter (was on the phone) they're aware that postoperative radiation therapy or chemotherapy might be recommended based on our final pathology report. Further decision to perform a lymphadenectomy we based on intraoperative frozen section.  Surgical dates were operative the patient and they've elected to proceed with surgery on 06/15/2016 understand that Dr. Everitt Amber would be the primary surgeon. All questions are answered.  HPI: 55 year old married female seen in consultation at the request of Dr. Butler Denmark regarding management of a newly diagnosed endometrial cancer. The patient presented with postmenopausal bleeding and underwent ultrasound showing a 24 mm endometrial stripe. Endometrial biopsy was obtained on 05/14/2016 revealing an endometrial adenocarcinoma appearing to be well-differentiated (grade 1) the patient denies any pain or any other gynecologic history.  Obstetrical history gravida 1.  The patient has no significant medical comorbidities  Review of Systems:10 point review of systems is negative except as noted in interval history.   Vitals: Blood pressure 103/64, pulse 66, temperature 98.3 F (36.8 C), temperature source Oral, resp. rate 18, height 4\' 11"  (1.499 m), weight 128 lb 6.4 oz (58.2 kg), SpO2 100 %.  Physical Exam: General : The patient is a healthy woman in no acute distress.  HEENT: normocephalic, extraoccular movements normal; neck is supple without thyromegally  Lynphnodes: Supraclavicular and inguinal nodes not enlarged  Abdomen: Soft, non-tender, no  ascites, no organomegally, no masses, no hernias  Pelvic:  EGBUS: Normal female  Vagina: Normal, no lesions  Urethra and Bladder: Normal, non-tender  Cervix: Normal there is no bleeding today. Uterus: Anterior normal shape size and consistency. Bi-manual examination: Non-tender; no adenxal masses or nodularity  Rectal: normal sphincter tone, no masses, no blood  Lower extremities: No edema or varicosities. Normal range of motion      Not on File  History reviewed. No pertinent past medical history.  History reviewed. No pertinent surgical history.  Current Outpatient Prescriptions  Medication Sig Dispense Refill  . calcium-vitamin D 250-100 MG-UNIT tablet Take 1 tablet by mouth 2 (two) times daily.    . Multiple Vitamins-Minerals (MULTIVITAMIN ADULTS PO) Take by mouth.     No current facility-administered medications for this visit.     Social History   Social History  . Marital status: Unknown    Spouse name: N/A  . Number of children: N/A  . Years of education: N/A   Occupational History  . Not on file.   Social History Main Topics  . Smoking status: Never Smoker  . Smokeless tobacco: Never Used  . Alcohol use No  . Drug use: No  . Sexual activity: Yes   Other Topics Concern  . Not on file   Social History Narrative  . No narrative on file    History reviewed. No pertinent family history.    Marti Sleigh, MD 05/24/2016, 1:06 PM

## 2016-06-15 NOTE — Transfer of Care (Signed)
Immediate Anesthesia Transfer of Care Note  Patient: Mallory Hughes  Procedure(s) Performed: Procedure(s): XI ROBOTIC ASSISTED LAPAROSCOPIC VAGINAL HYSTERECTOMY (N/A) XI ROBOTIC ASSISTED BILATERAL SALPINGO OOPHORECTOMY (Bilateral) BILATERAL SENTINAL LYMPH NODE DISSECTION (N/A)  Patient Location: PACU  Anesthesia Type:General  Level of Consciousness: sedated  Airway & Oxygen Therapy: Patient Spontanous Breathing and Patient connected to face mask oxygen  Post-op Assessment: Report given to RN and Post -op Vital signs reviewed and stable  Post vital signs: Reviewed and stable  Last Vitals:  Vitals:   06/15/16 1146  BP: 116/61  Pulse: 68  Resp: 16  Temp: 36.8 C    Last Pain:  Vitals:   06/15/16 1146  TempSrc: Oral      Patients Stated Pain Goal: 4 ([id number])  Complications: No apparent anesthesia complications

## 2016-06-15 NOTE — Op Note (Signed)
OPERATIVE NOTE 06/15/16  Surgeon: Donaciano Eva   Assistants: Dr Lahoma Crocker (an MD assistant was necessary for tissue manipulation, management of robotic instrumentation, retraction and positioning due to the complexity of the case and hospital policies).   Anesthesia: General endotracheal anesthesia  ASA Class: 3   Pre-operative Diagnosis: grade 1 endometrial cancer  Post-operative Diagnosis: same  Operation: Robotic-assisted laparoscopic total hysterectomy with bilateral salpingoophorectomy, bilateral sentinel lymph node biopsy  Surgeon: Donaciano Eva  Assistant Surgeon: Lahoma Crocker MD  Anesthesia: GET  Urine Output: 200  Operative Findings:  : 6cm normal appearing uterus, tubes and ovaries. Normal appearing lymph nodes. No gross extrauterine disease.  Estimated Blood Loss:  <20      Total IV Fluids: 600 ml         Specimens: uterus, cervix, bilateral tubes and ovaries, left and right obturator SLN         Complications:  None; patient tolerated the procedure well.         Disposition: PACU - hemodynamically stable.  Procedure Details  The patient was seen in the Holding Room. The risks, benefits, complications, treatment options, and expected outcomes were discussed with the patient.  The patient concurred with the proposed plan, giving informed consent.  The site of surgery properly noted/marked. The patient was identified as Mallory Hughes and the procedure verified as a Robotic-assisted hysterectomy with bilateral salpingo oophorectomy. A Time Out was held and the above information confirmed.  After induction of anesthesia, the patient was draped and prepped in the usual sterile manner. Pt was placed in supine position after anesthesia and draped and prepped in the usual sterile manner. The abdominal drape was placed after the CholoraPrep had been allowed to dry for 3 minutes.  Her arms were tucked to her side with all appropriate  precautions.  The shoulders were stabilized with padded shoulder blocks applied to the acromium processes.  The patient was placed in the semi-lithotomy position in [place].  The perineum was prepped with Betadine. The patient was then prepped. Foley catheter was placed.  A sterile speculum was placed in the vagina.  The cervix was grasped with a single-tooth tenaculum and dilated with Kennon Rounds dilators.  1mg  total of ICG was injected into the cervical stroma at 2 and 9 o'clock at a 51mm depth (concentration 0..5mg /ml). The ZUMI uterine manipulator with a medium colpotomizer ring was placed without difficulty.  A pneum occluder balloon was placed over the manipulator.  OG tube placement was confirmed and to suction.   Next, a 5 mm skin incision was made 1 cm below the subcostal margin in the midclavicular line.  The 5 mm Optiview port and scope was used for direct entry.  Opening pressure was under 10 mm CO2.  The abdomen was insufflated and the findings were noted as above.   At this point and all points during the procedure, the patient's intra-abdominal pressure did not exceed 15 mmHg. Next, a 10 mm skin incision was made in the umbilicus and a right and left port was placed about 10 cm lateral to the robot port on the right and left side.  A fourth arm was placed in the left lower quadrant 2 cm above and superior and medial to the anterior superior iliac spine.  All ports were placed under direct visualization.  The patient was placed in steep Trendelenburg.  Bowel was folded away into the upper abdomen.  The robot was docked in the normal manner.  The right  and left peritoneum were opened parallel to the IP ligament to open the retroperitoneal spaces bilaterally. The SLN mapping was performed in bilateral pelvic basins. The para rectal and paravesical spaces were opened up. Lymphatic channels were identified travelling to the following visualized sentinel lymph node's: left and right obturator SLN's.  These SLN's were separated from their surrounding lymphatic tissue, removed and sent for permanent pathology.  The hysterectomy was started after the round ligament on the right side was incised and the retroperitoneum was entered and the pararectal space was developed.  The ureter was noted to be on the medial leaf of the broad ligament.  The peritoneum above the ureter was incised and stretched and the infundibulopelvic ligament was skeletonized, cauterized and cut.  The posterior peritoneum was taken down to the level of the KOH ring.  The anterior peritoneum was also taken down.  The bladder flap was created to the level of the KOH ring.  The uterine artery on the right side was skeletonized, cauterized and cut in the normal manner.  A similar procedure was performed on the left.  The colpotomy was made and the uterus, cervix, bilateral ovaries and tubes were amputated and delivered through the vagina.  Pedicles were inspected and excellent hemostasis was achieved.    The colpotomy at the vaginal cuff was closed with Vicryl on a CT1 needle in a running manner.  Irrigation was used and excellent hemostasis was achieved.  At this point in the procedure was completed.  Robotic instruments were removed under direct visulaization.  The robot was undocked. The 10 mm ports were closed with Vicryl on a UR-5 needle and the fascia was closed with 0 Vicryl on a UR-5 needle.  The skin was closed with 4-0 Vicryl in a subcuticular manner.  Dermabond was applied.  Sponge, lap and needle counts correct x 2.  The patient was taken to the recovery room in stable condition.  The vagina was swabbed with  minimal bleeding noted.   All instrument and needle counts were correct x  3.   The patient was transferred to the recovery room in a stable condition.  Donaciano Eva, MD

## 2016-06-15 NOTE — Discharge Instructions (Signed)
06/15/2016  Return to work: 4 weeks  Activity: 1. Be up and out of the bed during the day.  Take a nap if needed.  You may walk up steps but be careful and use the hand rail.  Stair climbing will tire you more than you think, you may need to stop part way and rest.   2. No lifting or straining for 6 weeks.  3. No driving for 1 weeks.  Do Not drive if you are taking narcotic pain medicine.  4. Shower daily.  Use soap and water on your incision and pat dry; don't rub.   5. No sexual activity and nothing in the vagina for 8 weeks.  Diet: 1. Low sodium Heart Healthy Diet is recommended.  2. It is safe to use a laxative if you have difficulty moving your bowels.   Wound Care: 1. Keep clean and dry.  Shower daily.  Medications:  You should preferentially take tylenol or motrin for your pain. If these medications do not help, you can take percocet as prescribed. If you are taking percocet regularly, you should take senna each night to keep your bowels moving.  Reasons to call the Doctor:   Fever - Oral temperature greater than 100.4 degrees Fahrenheit  Foul-smelling vaginal discharge  Difficulty urinating  Nausea and vomiting  Increased pain at the site of the incision that is unrelieved with pain medicine.  Difficulty breathing with or without chest pain  New calf pain especially if only on one side  Sudden, continuing increased vaginal bleeding with or without clots.   Follow-up: 1. See Everitt Amber in 3 weeks. 2. Dr Serita Grit office will phone you with your pathology results when they are resulted  Contacts: For questions or concerns you should contact:  Dr. Everitt Amber at [phone]  or at [place]

## 2016-06-15 NOTE — Interval H&P Note (Signed)
History and Physical Interval Note:  06/15/2016 3:10 PM  Mallory Hughes  has presented today for surgery, with the diagnosis of ENDOMETRIAL ADENOCARCINOMA  The various methods of treatment have been discussed with the patient and family. After consideration of risks, benefits and other options for treatment, the patient has consented to  Procedure(s): XI ROBOTIC [place] (N/A) XI ROBOTIC ASSISTED SALPINGO OOPHORECTOMY (N/A) LYMPH NODE DISSECTION (N/A) as a surgical intervention .  The patient's history has been reviewed, patient examined, no change in status, stable for surgery.  I have reviewed the patient's chart and labs.  Questions were answered to the patient's satisfaction.     Donaciano Eva

## 2016-06-15 NOTE — Anesthesia Preprocedure Evaluation (Addendum)
Anesthesia Evaluation  Patient identified by MRN, date of birth, ID band Patient awake    Reviewed: Allergy & Precautions, NPO status , Patient's Chart, lab work & pertinent test results  Airway Mallampati: II  TM Distance: >3 FB Neck ROM: Full    Dental no notable dental hx.    Pulmonary neg pulmonary ROS,    Pulmonary exam normal breath sounds clear to auscultation       Cardiovascular negative cardio ROS Normal cardiovascular exam Rhythm:Regular Rate:Normal     Neuro/Psych negative neurological ROS  negative psych ROS   GI/Hepatic negative GI ROS, Neg liver ROS,   Endo/Other  negative endocrine ROS  Renal/GU negative Renal ROS  negative genitourinary   Musculoskeletal negative musculoskeletal ROS (+)   Abdominal   Peds negative pediatric ROS (+)  Hematology negative hematology ROS (+)   Anesthesia Other Findings   Reproductive/Obstetrics negative OB ROS                             Anesthesia Physical Anesthesia Plan  ASA: I  Anesthesia Plan: General   Post-op Pain Management:    Induction: Intravenous  Airway Management Planned: Oral ETT  Additional Equipment:   Intra-op Plan:   Post-operative Plan: Extubation in OR  Informed Consent: I have reviewed the patients History and Physical, chart, labs and discussed the procedure including the risks, benefits and alternatives for the proposed anesthesia with the patient or authorized representative who has indicated his/her understanding and acceptance.   Dental advisory given  Plan Discussed with: CRNA  Anesthesia Plan Comments:         Anesthesia Quick Evaluation  

## 2016-06-16 ENCOUNTER — Encounter (HOSPITAL_COMMUNITY): Payer: Self-pay | Admitting: Gynecologic Oncology

## 2016-06-16 DIAGNOSIS — C541 Malignant neoplasm of endometrium: Secondary | ICD-10-CM | POA: Diagnosis not present

## 2016-06-16 LAB — CBC
HCT: 37.4 % (ref 36.0–46.0)
Hemoglobin: 13 g/dL (ref 12.0–15.0)
MCH: 30.2 pg (ref 26.0–34.0)
MCHC: 34.8 g/dL (ref 30.0–36.0)
MCV: 86.8 fL (ref 78.0–100.0)
Platelets: 224 10*3/uL (ref 150–400)
RBC: 4.31 MIL/uL (ref 3.87–5.11)
RDW: 12.3 % (ref 11.5–15.5)
WBC: 8.5 10*3/uL (ref 4.0–10.5)

## 2016-06-16 LAB — BASIC METABOLIC PANEL
Anion gap: 7 (ref 5–15)
BUN: 6 mg/dL (ref 6–20)
CO2: 24 mmol/L (ref 22–32)
CREATININE: 0.62 mg/dL (ref 0.44–1.00)
Calcium: 8.7 mg/dL — ABNORMAL LOW (ref 8.9–10.3)
Chloride: 105 mmol/L (ref 101–111)
Glucose, Bld: 151 mg/dL — ABNORMAL HIGH (ref 65–99)
Potassium: 3.9 mmol/L (ref 3.5–5.1)
SODIUM: 136 mmol/L (ref 135–145)

## 2016-06-16 MED ORDER — SENNA 8.6 MG PO TABS: 1.0000 | ORAL_TABLET | Freq: Every day | ORAL | 0 refills | Status: DC

## 2016-06-16 MED ORDER — IBUPROFEN 800 MG PO TABS: 800.0000 mg | ORAL_TABLET | Freq: Three times a day (TID) | ORAL | 0 refills | Status: DC | PRN

## 2016-06-16 MED ORDER — OXYCODONE-ACETAMINOPHEN 5-325 MG PO TABS: 1.0000 | ORAL_TABLET | ORAL | 0 refills | Status: DC | PRN

## 2016-06-16 NOTE — Discharge Summary (Signed)
Physician Discharge Summary  Patient ID: Mallory Hughes MRN: [id number] DOB/AGE: 54-Jun-1962 54 y.o.  Admit date: 06/15/2016 Discharge date: 06/16/2016  Admission Diagnoses: <principal problem not specified>  Discharge Diagnoses:  Active Problems:   Endometrial cancer [facility])   Discharged Condition: good  Hospital Course:  1/ admitted on 06/15/16 for robotic assisted total hysterectomy, BSO and sentinel lymph node biopsy for endometrial cancer. 2/ uncomplicated surgery with unremarkable findings and minimal EBL 3/ patient meeting discharge criteria on POD 1 and voiding urine with stable H&H  Consults: None  Significant Diagnostic Studies: labs:  CBC    Component Value Date/Time   WBC 8.5 06/16/2016 0454   RBC 4.31 06/16/2016 0454   HGB 13.0 06/16/2016 0454   HCT 37.4 06/16/2016 0454   PLT 224 06/16/2016 0454   MCV 86.8 06/16/2016 0454   MCH 30.2 06/16/2016 0454   MCHC 34.8 06/16/2016 0454   RDW 12.3 06/16/2016 0454   LYMPHSABS 2.4 06/11/2016 1449   MONOABS 0.4 06/11/2016 1449   EOSABS 0.4 06/11/2016 1449   BASOSABS 0.0 06/11/2016 1449    Treatments: surgery: see above  Discharge Exam: Blood pressure (!) 104/47, pulse (!) 58, temperature 97.7 F (36.5 C), temperature source Oral, resp. rate 16, height 5' (1.524 m), weight 130 lb (59 kg), last menstrual period 01/10/2016, SpO2 100 %. General appearance: alert and cooperative Resp: clear to auscultation bilaterally Cardio: regular rate and rhythm, S1, S2 normal, no murmur, click, rub or gallop GI: soft, non-tender; bowel sounds normal; no masses,  no organomegaly Incision/Wound: clean dry and intact x 5  Disposition: Final discharge disposition not confirmed  Discharge Instructions    (HEART FAILURE PATIENTS) Call MD:  Anytime you have any of the following symptoms: 1) 3 pound weight gain in 24 hours or 5 pounds in 1 week 2) shortness of breath, with or without a dry hacking cough 3) swelling in the hands, feet  or stomach 4) if you have to sleep on extra pillows at night in order to breathe.    Complete by:  As directed    Call MD for:  difficulty breathing, headache or visual disturbances    Complete by:  As directed    Call MD for:  extreme fatigue    Complete by:  As directed    Call MD for:  hives    Complete by:  As directed    Call MD for:  persistant dizziness or light-headedness    Complete by:  As directed    Call MD for:  persistant nausea and vomiting    Complete by:  As directed    Call MD for:  redness, tenderness, or signs of infection (pain, swelling, redness, odor or green/yellow discharge around incision site)    Complete by:  As directed    Call MD for:  severe uncontrolled pain    Complete by:  As directed    Call MD for:  temperature >100.4    Complete by:  As directed    Diet - low sodium heart healthy    Complete by:  As directed    Diet general    Complete by:  As directed    Driving Restrictions    Complete by:  As directed    No driving for 7 days or until off narcotic pain medication   Increase activity slowly    Complete by:  As directed    Remove dressing in 24 hours    Complete by:  As directed  Sexual Activity Restrictions    Complete by:  As directed    No intercourse for 6 weeks       Medication List    TAKE these medications   ibuprofen 800 MG tablet Commonly known as:  ADVIL,MOTRIN Take 1 tablet (800 mg total) by mouth every 8 (eight) hours as needed (mild pain).   oxyCODONE-acetaminophen 5-325 MG tablet Commonly known as:  PERCOCET/ROXICET Take 1-2 tablets by mouth every 4 (four) hours as needed (moderate to severe pain (when tolerating fluids)).   senna 8.6 MG Tabs tablet Commonly known as:  SENOKOT Take 1 tablet (8.6 mg total) by mouth at bedtime. Take nightly if taking the percocet to prevent constipation. Do not use regularly if not regularly taking percocet      Follow-up Information    Donaciano Eva, MD Follow up in 3  week(s).   Specialty:  Obstetrics and Gynecology Contact information: [place] [address] [phone]        Donaciano Eva, MD Follow up.   Specialty:  Obstetrics and Gynecology Contact information: [place] [id number], [id number] [facility] [place] [address] [phone]           Signed: Donaciano Eva 06/16/2016, 8:43 AM

## 2016-07-28 ENCOUNTER — Encounter: Payer: Self-pay | Admitting: Gynecologic Oncology

## 2016-07-28 ENCOUNTER — Ambulatory Visit: Payer: Managed Care, Other (non HMO) | Attending: Gynecologic Oncology | Admitting: Gynecologic Oncology

## 2016-07-28 VITALS — BP 132/58 | HR 106 | Temp 99.6°F | Resp 18 | Ht 60.0 in | Wt 129.0 lb

## 2016-07-28 DIAGNOSIS — Z9889 Other specified postprocedural states: Secondary | ICD-10-CM | POA: Diagnosis not present

## 2016-07-28 DIAGNOSIS — Z90710 Acquired absence of both cervix and uterus: Secondary | ICD-10-CM | POA: Diagnosis not present

## 2016-07-28 DIAGNOSIS — Z90722 Acquired absence of ovaries, bilateral: Secondary | ICD-10-CM | POA: Insufficient documentation

## 2016-07-28 DIAGNOSIS — Z9079 Acquired absence of other genital organ(s): Secondary | ICD-10-CM | POA: Diagnosis not present

## 2016-07-28 DIAGNOSIS — C541 Malignant neoplasm of endometrium: Secondary | ICD-10-CM | POA: Diagnosis present

## 2016-07-28 NOTE — Patient Instructions (Signed)
Plan to follow up with Dr. Cletis Media in six months and Dr. Denman George in one year.  Please call [phone] after you see Dr. Cletis Media to schedule your appt with Dr. Denman George.  Please call for any questions or concerns or new symptoms.

## 2016-07-28 NOTE — Progress Notes (Signed)
Follow-up Note: Mallory   Del Hughes 56 y.o. female  Chief Complaint  Patient presents with  . 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   Plan: Pathology revealed low risk factors for recurrence, therefore no adjuvant therapy is recommended according to NCCN guidelines.  I discussed risk for recurrence and typical symptoms encouraged her to notify us of these should they develop between visits.  I recommend she have follow-up every 6 months for 5 years in accordance with NCCN guidelines. Those visits should include symptom assessment, physical exam and pelvic examination. Pap smears are not indicated or recommended in the routine surveillance of endometrial cancer.  She will follow-up with Dr Cletis Media annually with her next appointment in July, 2018. I will see her 6 months later in January 2019.  HPI: 56 year old married female seen in consultation at the request of Dr. Butler Denmark regarding management of a newly diagnosed endometrial cancer. The patient presented with postmenopausal bleeding and underwent ultrasound showing a 24 mm endometrial stripe. Endometrial biopsy was obtained on 05/14/2016 revealing an endometrial adenocarcinoma appearing to be well-differentiated (grade 1) the patient denies any pain or any other gynecologic history.  Interval Hx:  The patient underwent a robotic assisted total hysterectomy, BSO, SLN biopsy on 06/15/16. Surgery was uncomplicated. Final pathology revealed a stage IA grade 1 endometrial cancer with a 3.7cm tumor, no myometrial invasion and no LVSI. The SLN's were negative.  Since surgery she has done well with no complaints.  Review of Systems:10 point review of systems is negative except as noted in interval history.   Vitals: Blood pressure (!) 132/58, pulse (!) 106, temperature 99.6 F (37.6 C), temperature source Oral, resp. rate 18, height 5' (1.524 m),  weight 129 lb (58.5 kg), SpO2 99 %.  Physical Exam: General : The patient is a healthy woman in no acute distress.  HEENT: normocephalic, extraoccular movements normal; neck is supple without thyromegally  Lynphnodes: Supraclavicular and inguinal nodes not enlarged  Abdomen: Soft, non-tender, no ascites, no organomegally, no masses, no hernias. Incisions well healed. Pelvic:  EGBUS: Normal female  Vagina: Normal, no lesions, vaginal cuff in tact. Bi-manual examination: Non-tender; no adenxal masses or nodularity  Rectal: normal sphincter tone, no masses, no blood  Lower extremities: No edema or varicosities. Normal range of motion    No Known Allergies  History reviewed. No pertinent past medical history.  Past Surgical History:  Procedure Laterality Date  . LYMPH NODE DISSECTION N/A 06/15/2016   Procedure: BILATERAL SENTINAL LYMPH NODE DISSECTION;  Surgeon: Everitt Amber, MD;  Location: [facility];  Service: Gynecology;  Laterality: N/A;  . ROBOTIC ASSISTED LAP VAGINAL HYSTERECTOMY N/A 06/15/2016   Procedure: XI ROBOTIC ASSISTED LAPAROSCOPIC VAGINAL HYSTERECTOMY;  Surgeon: Everitt Amber, MD;  Location: [facility];  Service: Gynecology;  Laterality: N/A;  . ROBOTIC ASSISTED SALPINGO OOPHERECTOMY Bilateral 06/15/2016   Procedure: XI ROBOTIC ASSISTED BILATERAL SALPINGO OOPHORECTOMY;  Surgeon: Everitt Amber, MD;  Location: [facility];  Service: Gynecology;  Laterality: Bilateral;    Current Outpatient Prescriptions  Medication Sig Dispense Refill  . ibuprofen (ADVIL,MOTRIN) 800 MG tablet Take 1 tablet (800 mg total) by mouth every 8 (eight) hours as needed (mild pain). 30 tablet 0  . oxyCODONE-acetaminophen (PERCOCET/ROXICET) 5-325 MG tablet Take 1-2 tablets by mouth every 4 (four) hours as needed (moderate to severe pain (when tolerating fluids)). 30 tablet 0  . senna (SENOKOT) 8.6 MG TABS tablet Take 1 tablet (8.6 mg total) by  mouth at bedtime. Take nightly if taking the percocet to prevent  constipation. Do not use regularly if not regularly taking percocet 120 each 0   No current facility-administered medications for this visit.     Social History   Social History  . Marital status: Married    Spouse name: N/A  . Number of children: N/A  . Years of education: N/A   Occupational History  . Not on file.   Social History Main Topics  . Smoking status: Never Smoker  . Smokeless tobacco: Never Used  . Alcohol use No  . Drug use: No  . Sexual activity: Yes   Other Topics Concern  . Not on file   Social History Narrative  . No narrative on file    History reviewed. No pertinent family history.   15 minutes of direct face to face counseling time was spent with the patient. This included discussion about prognosis, therapy recommendations and postoperative side effects and are beyond the scope of routine postoperative care.    Donaciano Eva, MD 07/28/2016, 4:15 PM

## 2016-11-09 ENCOUNTER — Other Ambulatory Visit: Payer: Self-pay | Admitting: Internal Medicine

## 2016-11-09 DIAGNOSIS — Z1231 Encounter for screening mammogram for malignant neoplasm of breast: Secondary | ICD-10-CM

## 2017-01-03 ENCOUNTER — Ambulatory Visit
Admission: RE | Admit: 2017-01-03 | Discharge: 2017-01-03 | Disposition: A | Discharge status: Home / Self Care | Payer: Managed Care, Other (non HMO) | Source: Ambulatory Visit | Attending: Internal Medicine | Admitting: Internal Medicine

## 2017-01-03 DIAGNOSIS — Z1231 Encounter for screening mammogram for malignant neoplasm of breast: Secondary | ICD-10-CM

## 2017-01-24 ENCOUNTER — Other Ambulatory Visit: Payer: Self-pay | Admitting: Obstetrics and Gynecology

## 2017-04-25 ENCOUNTER — Telehealth: Payer: Self-pay | Admitting: *Deleted

## 2017-04-25 NOTE — Telephone Encounter (Signed)
Returned the call to the patient's husband and scheduled an appt for January 25th at 1:15pm.

## 2017-07-29 ENCOUNTER — Encounter: Payer: Self-pay | Admitting: Gynecologic Oncology

## 2017-07-29 ENCOUNTER — Inpatient Hospital Stay: Payer: Managed Care, Other (non HMO) | Attending: Gynecologic Oncology | Admitting: Gynecologic Oncology

## 2017-07-29 VITALS — BP 111/65 | HR 66 | Temp 97.8°F | Resp 18 | Ht 59.0 in | Wt 133.4 lb

## 2017-07-29 DIAGNOSIS — Z90722 Acquired absence of ovaries, bilateral: Secondary | ICD-10-CM

## 2017-07-29 DIAGNOSIS — Z79899 Other long term (current) drug therapy: Secondary | ICD-10-CM | POA: Diagnosis not present

## 2017-07-29 DIAGNOSIS — C541 Malignant neoplasm of endometrium: Secondary | ICD-10-CM | POA: Diagnosis not present

## 2017-07-29 DIAGNOSIS — Z90710 Acquired absence of both cervix and uterus: Secondary | ICD-10-CM | POA: Diagnosis not present

## 2017-07-29 NOTE — Patient Instructions (Signed)
Please notify Dr Denman George at phone number [phone] if you notice vaginal bleeding, new pelvic or abdominal pains, bloating, feeling full easy, or a change in bladder or bowel function.   Please contact Dr Serita Grit office in October of 2019 or later to schedule follow-up with her in January, 2020.  Please return to see Dr Cletis Media in July, 2019.

## 2017-07-29 NOTE — Progress Notes (Signed)
Follow-up Note: Gyn-Onc   Mallory Hughes 57 y.o. female  Chief Complaint  Patient presents with  . Endometrial cancer [facility])    Assessment :Stage IA grade 1 endometrioid endometrial adenocarcinoma, s/p staging surgery on 06/15/16.   Plan: Pathology revealed low risk factors for recurrence, therefore no adjuvant therapy was recommended according to NCCN guidelines.  I discussed risk for recurrence and typical symptoms encouraged her to notify us of these should they develop between visits.  I recommend she continue follow-up every 6 months for 5 years in accordance with NCCN guidelines. Those visits should include symptom assessment, physical exam and pelvic examination. Pap smears are not indicated or recommended in the routine surveillance of endometrial cancer.  She will follow-up with Dr Cletis Media annually with her next appointment in July, 2019. I will see her 6 months later in January 2020.  HPI: 57 year old married female seen in consultation at the request of Dr. Butler Denmark regarding management of a newly diagnosed endometrial cancer. The patient presented with postmenopausal bleeding and underwent ultrasound showing a 24 mm endometrial stripe. Endometrial biopsy was obtained on 05/14/2016 revealing an endometrial adenocarcinoma appearing to be well-differentiated (grade 1) the patient denies any pain or any other gynecologic history.  The patient underwent a robotic assisted total hysterectomy, BSO, SLN biopsy on 06/15/16. Surgery was uncomplicated. Final pathology revealed a stage IA grade 1 endometrial cancer with a 3.7cm tumor, no myometrial invasion and no LVSI. The SLN's were negative. She was felt to have low risk factors for recurrence in accordance with NCCN guidelines no adjuvant therapy was recommended.  Interval Hx:  She saw Dr Cletis Media for follow-up in July, 2019 and a polyp at the vagina cuff was identified and removed. Pathology confirmed benign granulation tissue.  She  denies vaginal bleeding, pain or symptoms concerning for recurrence.  Review of Systems:10 point review of systems is negative except as noted in interval history.   Vitals: Blood pressure 111/65, pulse 66, temperature 97.8 F (36.6 C), temperature source Oral, resp. rate 18, height 4\' 11"  (1.499 m), weight 133 lb 6.4 oz (60.5 kg), SpO2 100 %.  Physical Exam: General : The patient is a healthy woman in no acute distress.  HEENT: normocephalic, extraoccular movements normal; neck is supple without thyromegally  Lynphnodes: Supraclavicular and inguinal nodes not enlarged  Abdomen: Soft, non-tender, no ascites, no organomegally, no masses, no hernias. Incisions well healed. Pelvic:  EGBUS: Normal female  Vagina: Normal, no lesions, vaginal cuff in tact. Bi-manual examination: Non-tender; no adenxal masses or nodularity  Rectal: normal sphincter tone, no masses, no blood  Lower extremities: No edema or varicosities. Normal range of motion    No Known Allergies  History reviewed. No pertinent past medical history.  Past Surgical History:  Procedure Laterality Date  . LYMPH NODE DISSECTION N/A 06/15/2016   Procedure: BILATERAL SENTINAL LYMPH NODE DISSECTION;  Surgeon: Everitt Amber, MD;  Location: [facility];  Service: Gynecology;  Laterality: N/A;  . ROBOTIC ASSISTED LAP VAGINAL HYSTERECTOMY N/A 06/15/2016   Procedure: XI ROBOTIC ASSISTED LAPAROSCOPIC VAGINAL HYSTERECTOMY;  Surgeon: Everitt Amber, MD;  Location: [facility];  Service: Gynecology;  Laterality: N/A;  . ROBOTIC ASSISTED SALPINGO OOPHERECTOMY Bilateral 06/15/2016   Procedure: XI ROBOTIC ASSISTED BILATERAL SALPINGO OOPHORECTOMY;  Surgeon: Everitt Amber, MD;  Location: [facility];  Service: Gynecology;  Laterality: Bilateral;    Current Outpatient Medications  Medication Sig Dispense Refill  . cholecalciferol (VITAMIN D) 1000 units tablet Take 1,000 Units by mouth daily. Patient is taking 2 tabs daily.    Marland Kitchen  Multiple Vitamin (MULTIVITAMIN)  capsule Take 1 capsule by mouth daily.     No current facility-administered medications for this visit.     Social History   Socioeconomic History  . Marital status: Married    Spouse name: Not on file  . Number of children: Not on file  . Years of education: Not on file  . Highest education level: Not on file  Social Needs  . Financial resource strain: Not on file  . Food insecurity - worry: Not on file  . Food insecurity - inability: Not on file  . Transportation needs - medical: Not on file  . Transportation needs - non-medical: Not on file  Occupational History  . Not on file  Tobacco Use  . Smoking status: Never Smoker  . Smokeless tobacco: Never Used  Substance and Sexual Activity  . Alcohol use: No  . Drug use: No  . Sexual activity: Yes  Other Topics Concern  . Not on file  Social History Narrative  . Not on file    History reviewed. No pertinent family history.   Mallory Eva, MD 07/29/2017, 1:51 PM

## 2017-11-22 ENCOUNTER — Other Ambulatory Visit: Payer: Self-pay | Admitting: Internal Medicine

## 2017-11-22 DIAGNOSIS — Z1231 Encounter for screening mammogram for malignant neoplasm of breast: Secondary | ICD-10-CM

## 2018-01-04 ENCOUNTER — Ambulatory Visit
Admission: RE | Admit: 2018-01-04 | Discharge: 2018-01-04 | Disposition: A | Discharge status: Home / Self Care | Payer: Managed Care, Other (non HMO) | Source: Ambulatory Visit | Attending: Internal Medicine | Admitting: Internal Medicine

## 2018-01-04 DIAGNOSIS — Z1231 Encounter for screening mammogram for malignant neoplasm of breast: Secondary | ICD-10-CM

## 2018-05-22 ENCOUNTER — Telehealth: Payer: Self-pay

## 2018-05-22 NOTE — Telephone Encounter (Signed)
Returned call regarding scheduling f/u appt with Dr Denman George for January 2020.  Appt made for Jan 15th at 3:15 pm.  Aggreable with appt date/time, No other needs verbalized at this time.

## 2018-06-20 ENCOUNTER — Ambulatory Visit (INDEPENDENT_AMBULATORY_CARE_PROVIDER_SITE_OTHER): Payer: Managed Care, Other (non HMO) | Admitting: Internal Medicine

## 2018-06-20 ENCOUNTER — Encounter: Payer: Self-pay | Admitting: Internal Medicine

## 2018-06-20 VITALS — BP 110/78 | HR 53 | Temp 97.4°F | Ht 59.75 in | Wt 134.5 lb

## 2018-06-20 DIAGNOSIS — C541 Malignant neoplasm of endometrium: Secondary | ICD-10-CM | POA: Diagnosis not present

## 2018-06-20 DIAGNOSIS — Z0000 Encounter for general adult medical examination without abnormal findings: Secondary | ICD-10-CM | POA: Diagnosis not present

## 2018-06-20 LAB — CMP14+EGFR
A/G RATIO: 1.5 (ref 1.2–2.2)
ALBUMIN: 4.4 g/dL (ref 3.5–5.5)
ALK PHOS: 72 IU/L (ref 39–117)
ALT: 12 IU/L (ref 0–32)
AST: 15 IU/L (ref 0–40)
BILIRUBIN TOTAL: 0.3 mg/dL (ref 0.0–1.2)
BUN / CREAT RATIO: 7 — AB (ref 9–23)
BUN: 5 mg/dL — ABNORMAL LOW (ref 6–24)
CHLORIDE: 99 mmol/L (ref 96–106)
CO2: 23 mmol/L (ref 20–29)
Calcium: 9.1 mg/dL (ref 8.7–10.2)
Creatinine, Ser: 0.68 mg/dL (ref 0.57–1.00)
GFR calc non Af Amer: 98 mL/min/{1.73_m2} (ref >59)
GFR, EST AFRICAN AMERICAN: 113 mL/min/{1.73_m2} (ref >59)
GLOBULIN, TOTAL: 3 g/dL (ref 1.5–4.5)
Glucose: 93 mg/dL (ref 65–99)
POTASSIUM: 3.5 mmol/L (ref 3.5–5.2)
SODIUM: 138 mmol/L (ref 134–144)
TOTAL PROTEIN: 7.4 g/dL (ref 6.0–8.5)

## 2018-06-20 LAB — POCT URINALYSIS DIPSTICK
BILIRUBIN UA: NEGATIVE
Glucose, UA: NEGATIVE
KETONES UA: NEGATIVE
NITRITE UA: NEGATIVE
PH UA: 7 (ref 5.0–8.0)
Protein, UA: NEGATIVE
RBC UA: NEGATIVE
Spec Grav, UA: 1.01 (ref 1.010–1.025)
UROBILINOGEN UA: 0.2 U/dL

## 2018-06-20 LAB — LIPID PANEL
CHOL/HDL RATIO: 3.9 ratio (ref 0.0–4.4)
Cholesterol, Total: 146 mg/dL (ref 100–199)
HDL: 37 mg/dL — ABNORMAL LOW (ref >39)
LDL Calculated: 71 mg/dL (ref 0–99)
TRIGLYCERIDES: 188 mg/dL — AB (ref 0–149)
VLDL Cholesterol Cal: 38 mg/dL (ref 5–40)

## 2018-06-20 LAB — HEMOGLOBIN A1C
Est. average glucose Bld gHb Est-mCnc: 117 mg/dL
Hgb A1c MFr Bld: 5.7 % — ABNORMAL HIGH (ref 4.8–5.6)

## 2018-06-20 LAB — CBC
HEMATOCRIT: 38.2 % (ref 34.0–46.6)
Hemoglobin: 13.1 g/dL (ref 11.1–15.9)
MCH: 29.1 pg (ref 26.6–33.0)
MCHC: 34.3 g/dL (ref 31.5–35.7)
MCV: 85 fL (ref 79–97)
PLATELETS: 244 10*3/uL (ref 150–450)
RBC: 4.5 x10E6/uL (ref 3.77–5.28)
RDW: 12.1 % — AB (ref 12.3–15.4)
WBC: 6.8 10*3/uL (ref 3.4–10.8)

## 2018-06-20 NOTE — Progress Notes (Signed)
Subjective:     Patient ID: Mallory Hughes , female    DOB: 02-16-1961 , 57 y.o.   MRN: [id number]   Chief Complaint  Patient presents with  . Annual Exam    HPI  She is here today for a full physical exam. She is now followed by Dr. Cletis Media for her gyn exams. SHE has  had hysterectomy in 2017 and was found to have endometrial cancer. She is still followed by Gyn-Onc.     History reviewed. No pertinent past medical history.   Family History  Problem Relation Age of Onset  . Healthy Mother   . Healthy Father      Current Outpatient Medications:  .  cholecalciferol (VITAMIN D) 1000 units tablet, Take 1,000 Units by mouth daily. Patient is taking 2 tabs daily., Disp: , Rfl:  .  Multiple Vitamin (MULTIVITAMIN) capsule, Take 1 capsule by mouth daily., Disp: , Rfl:    No Known Allergies     Patient's last menstrual period was 01/10/2016 (approximate)..  She has had a hysterectomy.  Negative for: breast discharge, breast lump(s), breast pain and breast self exam. Associated symptoms include abnormal vaginal bleeding. Pertinent negatives include abnormal bleeding (hematology), anxiety, decreased libido, depression, difficulty falling sleep, dyspareunia, history of infertility, nocturia, sexual dysfunction, sleep disturbances, urinary incontinence, urinary urgency, vaginal discharge and vaginal itching. Diet regular.The patient states her exercise level is  moderate.  . The patient's tobacco use is:  Social History   Tobacco Use  Smoking Status Never Smoker  Smokeless Tobacco Never Used  . She has been exposed to passive smoke. The patient's alcohol use is:  Social History   Substance and Sexual Activity  Alcohol Use No  . Additional information: Last pap 04/01/2016, next one scheduled for 2020.   Review of Systems  Constitutional: Negative.   HENT: Negative.   Eyes: Negative.   Respiratory: Negative.   Cardiovascular: Negative.   Endocrine: Negative.   Genitourinary:  Negative.   Musculoskeletal: Negative.   Skin: Negative.   Allergic/Immunologic: Negative.   Neurological: Negative.   Hematological: Negative.   Psychiatric/Behavioral: Negative.      Today's Vitals   06/20/18 0942  BP: 110/78  Pulse: (!) 53  Temp: (!) 97.4 F (36.3 C)  TempSrc: Oral  Weight: 134 lb 8 oz (61 kg)  Height: 4' 11.75" (1.518 m)   Body mass index is 26.49 kg/m.   Objective:  Physical Exam Vitals signs and nursing note reviewed.  Constitutional:      Appearance: Normal appearance. She is normal weight.  HENT:     Head: Normocephalic and atraumatic.     Right Ear: Tympanic membrane, ear canal and external ear normal.     Left Ear: Tympanic membrane, ear canal and external ear normal.     Nose: Nose normal.     Mouth/Throat:     Mouth: Mucous membranes are moist.     Pharynx: Oropharynx is clear.  Eyes:     Extraocular Movements: Extraocular movements intact.     Conjunctiva/sclera: Conjunctivae normal.     Pupils: Pupils are equal, round, and reactive to light.  Neck:     Musculoskeletal: Normal range of motion and neck supple.  Cardiovascular:     Rate and Rhythm: Regular rhythm. Bradycardia present.     Pulses: Normal pulses.     Heart sounds: Normal heart sounds.  Pulmonary:     Effort: Pulmonary effort is normal.     Breath sounds: Normal breath sounds.  Chest:  Breasts:        Right: Normal. No swelling, bleeding, inverted nipple, mass or nipple discharge.        Left: Normal. No swelling, bleeding, inverted nipple, mass or nipple discharge.  Abdominal:     General: Abdomen is flat. Bowel sounds are normal.  Genitourinary:    Comments: deferred Musculoskeletal: Normal range of motion.  Skin:    General: Skin is warm and dry.     Capillary Refill: Capillary refill takes less than 2 seconds.  Neurological:     General: No focal deficit present.     Mental Status: She is alert and oriented to person, place, and time.  Psychiatric:         Mood and Affect: Mood normal.         Assessment And Plan:     1. Routine general medical examination at health care facility  A full exam was performed.  Importance of monthly self breast exams was discussed with the patient.  PATIENT HAS BEEN ADVISED TO GET 30-45 MINUTES REGULAR EXERCISE NO LESS THAN FOUR TO FIVE DAYS PER WEEK - BOTH WEIGHTBEARING EXERCISES AND AEROBIC ARE RECOMMENDED.  SHE IS ADVISED TO FOLLOW A HEALTHY DIET WITH AT LEAST SIX FRUITS/VEGGIES PER DAY, DECREASE INTAKE OF RED MEAT, AND TO INCREASE FISH INTAKE TO TWO DAYS PER WEEK.  MEATS/FISH SHOULD NOT BE FRIED, BAKED OR BROILED IS PREFERABLE.  I SUGGEST WEARING SPF 50 SUNSCREEN ON EXPOSED PARTS AND ESPECIALLY WHEN IN THE DIRECT SUNLIGHT FOR AN EXTENDED PERIOD OF TIME.  PLEASE AVOID FAST FOOD RESTAURANTS AND INCREASE YOUR WATER INTAKE.  - CMP14+EGFR - CBC - Lipid panel - Hemoglobin A1c - POCT Urinalysis Dipstick (33582)        Maximino Greenland, MD

## 2018-06-20 NOTE — Patient Instructions (Signed)

## 2018-06-21 NOTE — Progress Notes (Signed)
Here are your lab results:  Your liver and kidney function are normal. Your blood count is normal. Your triglycerides are elevated. This implies that you are not moving enough (exercise) or eating too many processed foods. Increased intake of sugary beverages can result in this too. I suggest you start taking a fiber supplement. Lastly, your hba1c is 5.7. This is in the prediabetes range - but normal is 5.6. you are almost there!  Happy holidays to you and your family!  Sincerely,    Lilybelle Mayeda N. [facility], MD

## 2018-07-10 ENCOUNTER — Ambulatory Visit: Payer: Managed Care, Other (non HMO) | Admitting: Gynecologic Oncology

## 2018-07-19 ENCOUNTER — Inpatient Hospital Stay: Payer: Managed Care, Other (non HMO) | Attending: Gynecologic Oncology | Admitting: Gynecologic Oncology

## 2018-07-19 ENCOUNTER — Encounter: Payer: Self-pay | Admitting: Gynecologic Oncology

## 2018-07-19 VITALS — BP 105/54 | HR 67 | Temp 98.6°F | Resp 18 | Ht 59.0 in | Wt 134.0 lb

## 2018-07-19 DIAGNOSIS — Z90722 Acquired absence of ovaries, bilateral: Secondary | ICD-10-CM | POA: Diagnosis not present

## 2018-07-19 DIAGNOSIS — C541 Malignant neoplasm of endometrium: Secondary | ICD-10-CM | POA: Diagnosis present

## 2018-07-19 DIAGNOSIS — Z90710 Acquired absence of both cervix and uterus: Secondary | ICD-10-CM | POA: Diagnosis not present

## 2018-07-19 NOTE — Patient Instructions (Signed)
Please notify Dr Denman George at phone number [phone] if you notice vaginal bleeding, new pelvic or abdominal pains, bloating, feeling full easy, or a change in bladder or bowel function.   Please follow-up with Dr Cletis Media in July, 2020 for a pelvic examination.  Please contact Dr Serita Grit office (at [phone]) in October, 2020 to request an appointment with her for January, 2021.

## 2018-07-19 NOTE — Progress Notes (Signed)
Follow-up Note: Gyn-Onc   Mallory Hughes 57 y.o. female  Chief Complaint  Patient presents with  . Endometrial cancer [facility])    Assessment :Stage IA grade 1 endometrioid endometrial adenocarcinoma, s/p staging surgery on 06/15/16.   Plan: Pathology revealed low risk factors for recurrence, therefore no adjuvant therapy was recommended according to NCCN guidelines.  I discussed risk for recurrence and typical symptoms encouraged her to notify us of these should they develop between visits.  I recommend she continue follow-up every 6 months for 5 years in accordance with NCCN guidelines. Those visits should include symptom assessment, physical exam and pelvic examination. Pap smears are not indicated or recommended in the routine surveillance of endometrial cancer.  She will follow-up with Dr Cletis Media annually with her next appointment in July, 2019. I will see her 6 months later in January 2021.  HPI: 58 year old married female seen in consultation at the request of Dr. Butler Denmark regarding management of a newly diagnosed endometrial cancer. The patient presented with postmenopausal bleeding and underwent ultrasound showing a 24 mm endometrial stripe. Endometrial biopsy was obtained on 05/14/2016 revealing an endometrial adenocarcinoma appearing to be well-differentiated (grade 1) the patient denies any pain or any other gynecologic history.  The patient underwent a robotic assisted total hysterectomy, BSO, SLN biopsy on 06/15/16. Surgery was uncomplicated. Final pathology revealed a stage IA grade 1 endometrial cancer with a 3.7cm tumor, no myometrial invasion and no LVSI. The SLN's were negative. She was felt to have low risk factors for recurrence in accordance with NCCN guidelines no adjuvant therapy was recommended.  Interval Hx:  She saw Dr Cletis Media for follow-up in July, 2019 and a polyp at the vagina cuff was identified and removed. Pathology confirmed benign granulation tissue.  She  denies vaginal bleeding, pain or symptoms concerning for recurrence.  Review of Systems:10 point review of systems is negative except as noted in interval history.   Vitals: Blood pressure (!) 105/54, pulse 67, temperature 98.6 F (37 C), temperature source Oral, resp. rate 18, height 4\' 11"  (1.499 m), weight 134 lb (60.8 kg), last menstrual period 01/10/2016, SpO2 100 %.  Physical Exam: General : The patient is a healthy woman in no acute distress.  HEENT: normocephalic, extraoccular movements normal; neck is supple without thyromegally  Lynphnodes: Supraclavicular and inguinal nodes not enlarged  Abdomen: Soft, non-tender, no ascites, no organomegally, no masses, no hernias. Incisions well healed. Pelvic:  EGBUS: Normal female  Vagina: Normal, no lesions, vaginal cuff in tact. Bi-manual examination: Non-tender; no adenxal masses or nodularity  Rectal: normal sphincter tone, no masses, no blood  Lower extremities: No edema or varicosities. Normal range of motion    No Known Allergies  History reviewed. No pertinent past medical history.  Past Surgical History:  Procedure Laterality Date  . LYMPH NODE DISSECTION N/A 06/15/2016   Procedure: BILATERAL SENTINAL LYMPH NODE DISSECTION;  Surgeon: Everitt Amber, MD;  Location: [facility];  Service: Gynecology;  Laterality: N/A;  . ROBOTIC ASSISTED LAP VAGINAL HYSTERECTOMY N/A 06/15/2016   Procedure: XI ROBOTIC ASSISTED LAPAROSCOPIC VAGINAL HYSTERECTOMY;  Surgeon: Everitt Amber, MD;  Location: [facility];  Service: Gynecology;  Laterality: N/A;  . ROBOTIC ASSISTED SALPINGO OOPHERECTOMY Bilateral 06/15/2016   Procedure: XI ROBOTIC ASSISTED BILATERAL SALPINGO OOPHORECTOMY;  Surgeon: Everitt Amber, MD;  Location: [facility];  Service: Gynecology;  Laterality: Bilateral;    Current Outpatient Medications  Medication Sig Dispense Refill  . cholecalciferol (VITAMIN D) 1000 units tablet Take 1,000 Units by mouth daily. Patient is taking  2 tabs daily.    . Multiple  Vitamin (MULTIVITAMIN) capsule Take 1 capsule by mouth daily.     No current facility-administered medications for this visit.     Social History   Socioeconomic History  . Marital status: Married    Spouse name: Not on file  . Number of children: Not on file  . Years of education: Not on file  . Highest education level: Not on file  Occupational History  . Not on file  Social Needs  . Financial resource strain: Not on file  . Food insecurity:    Worry: Not on file    Inability: Not on file  . Transportation needs:    Medical: Not on file    Non-medical: Not on file  Tobacco Use  . Smoking status: Never Smoker  . Smokeless tobacco: Never Used  Substance and Sexual Activity  . Alcohol use: No  . Drug use: No  . Sexual activity: Yes  Lifestyle  . Physical activity:    Days per week: Not on file    Minutes per session: Not on file  . Stress: Not on file  Relationships  . Social connections:    Talks on phone: Not on file    Gets together: Not on file    Attends religious service: Not on file    Active member of club or organization: Not on file    Attends meetings of clubs or organizations: Not on file    Relationship status: Not on file  . Intimate partner violence:    Fear of current or ex partner: Not on file    Emotionally abused: Not on file    Physically abused: Not on file    Forced sexual activity: Not on file  Other Topics Concern  . Not on file  Social History Narrative  . Not on file    Family History  Problem Relation Age of Onset  . Healthy Mother   . Healthy Father      Thereasa Solo, MD 07/19/2018, 4:06 PM

## 2018-12-11 ENCOUNTER — Other Ambulatory Visit: Payer: Self-pay | Admitting: Internal Medicine

## 2018-12-11 DIAGNOSIS — Z1231 Encounter for screening mammogram for malignant neoplasm of breast: Secondary | ICD-10-CM

## 2019-01-25 ENCOUNTER — Ambulatory Visit
Admission: RE | Admit: 2019-01-25 | Discharge: 2019-01-25 | Disposition: A | Discharge status: Home / Self Care | Payer: BC Managed Care – PPO | Source: Ambulatory Visit | Attending: Internal Medicine | Admitting: Internal Medicine

## 2019-01-25 ENCOUNTER — Other Ambulatory Visit: Payer: Self-pay

## 2019-01-25 DIAGNOSIS — Z1231 Encounter for screening mammogram for malignant neoplasm of breast: Secondary | ICD-10-CM | POA: Diagnosis not present

## 2019-01-29 DIAGNOSIS — Z01419 Encounter for gynecological examination (general) (routine) without abnormal findings: Secondary | ICD-10-CM | POA: Diagnosis not present

## 2019-01-29 DIAGNOSIS — Z1231 Encounter for screening mammogram for malignant neoplasm of breast: Secondary | ICD-10-CM | POA: Diagnosis not present

## 2019-01-29 DIAGNOSIS — Z1211 Encounter for screening for malignant neoplasm of colon: Secondary | ICD-10-CM | POA: Diagnosis not present

## 2019-01-29 DIAGNOSIS — Z6825 Body mass index (BMI) 25.0-25.9, adult: Secondary | ICD-10-CM | POA: Diagnosis not present

## 2019-06-13 ENCOUNTER — Telehealth: Payer: Self-pay | Admitting: *Deleted

## 2019-06-13 NOTE — Telephone Encounter (Signed)
Returned the patient's husband's call and scheduled an appt for January

## 2019-06-26 ENCOUNTER — Ambulatory Visit (INDEPENDENT_AMBULATORY_CARE_PROVIDER_SITE_OTHER): Payer: BC Managed Care – PPO | Admitting: Internal Medicine

## 2019-06-26 ENCOUNTER — Encounter: Payer: Self-pay | Admitting: Internal Medicine

## 2019-06-26 ENCOUNTER — Other Ambulatory Visit: Payer: Self-pay

## 2019-06-26 VITALS — BP 108/64 | HR 50 | Temp 97.4°F | Ht 59.0 in | Wt 128.4 lb

## 2019-06-26 DIAGNOSIS — R635 Abnormal weight gain: Secondary | ICD-10-CM | POA: Diagnosis not present

## 2019-06-26 DIAGNOSIS — Z0000 Encounter for general adult medical examination without abnormal findings: Secondary | ICD-10-CM

## 2019-06-26 LAB — POCT URINALYSIS DIPSTICK
Bilirubin, UA: NEGATIVE
Glucose, UA: NEGATIVE
Ketones, UA: NEGATIVE
Nitrite, UA: NEGATIVE
Protein, UA: NEGATIVE
Spec Grav, UA: 1.015 (ref 1.010–1.025)
Urobilinogen, UA: 0.2 E.U./dL
pH, UA: 7 (ref 5.0–8.0)

## 2019-06-26 NOTE — Progress Notes (Signed)
This visit occurred during the SARS-CoV-2 public health emergency.  Safety protocols were in place, including screening questions prior to the visit, additional usage of staff PPE, and extensive cleaning of exam room while observing appropriate contact time as indicated for disinfecting solutions.  Subjective:     Patient ID: Mallory Hughes , female    DOB: [birth year] , 58 y.o.   MRN: [id number]   Chief Complaint  Patient presents with  . Annual Exam    HPI  She is here today for a full physical examination. She is followed by Dr. Cletis Media for her GYN exams.  She was last seen July 2020. She is also s/p hysterectomy.     No past medical history on file.   Family History  Problem Relation Age of Onset  . Healthy Mother   . Healthy Father      Current Outpatient Medications:  .  cholecalciferol (VITAMIN D) 1000 units tablet, Take 1,000 Units by mouth daily. Patient is taking 2 tabs daily., Disp: , Rfl:  .  Multiple Vitamin (MULTIVITAMIN) capsule, Take 1 capsule by mouth daily., Disp: , Rfl:    No Known Allergies    The patient states she uses none for birth control. Last LMP was Patient's last menstrual period was 01/10/2016 (approximate).. Negative for DysmenorrheaNegative for: breast discharge, breast lump(s), breast pain and breast self exam. Associated symptoms include abnormal vaginal bleeding. Pertinent negatives include abnormal bleeding (hematology), anxiety, decreased libido, depression, difficulty falling sleep, dyspareunia, history of infertility, nocturia, sexual dysfunction, sleep disturbances, urinary incontinence, urinary urgency, vaginal discharge and vaginal itching. Diet regular.The patient states her exercise level is  moderate.  . The patient's tobacco use is:  Social History   Tobacco Use  Smoking Status Never Smoker  Smokeless Tobacco Never Used  . She has been exposed to passive smoke. The patient's alcohol use is:  Social History   Substance and  Sexual Activity  Alcohol Use No    Review of Systems  Constitutional: Negative.   HENT: Negative.   Eyes: Negative.   Respiratory: Negative.   Cardiovascular: Negative.   Endocrine: Negative.   Genitourinary: Negative.   Musculoskeletal: Negative.   Skin: Negative.   Allergic/Immunologic: Negative.   Neurological: Negative.   Hematological: Negative.   Psychiatric/Behavioral: Negative.      Today's Vitals   06/26/19 0911  BP: 108/64  Pulse: (!) 50  Temp: (!) 97.4 F (36.3 C)  TempSrc: Oral  Weight: 128 lb 6.4 oz (58.2 kg)  Height: 4' 11" (1.499 m)   Body mass index is 25.93 kg/m.   Objective:  Physical Exam Vitals and nursing note reviewed.  Constitutional:      Appearance: Normal appearance.  HENT:     Head: Normocephalic and atraumatic.     Right Ear: Tympanic membrane, ear canal and external ear normal.     Left Ear: Tympanic membrane, ear canal and external ear normal.     Nose:     Comments: Deferred, masked    Mouth/Throat:     Comments: Deferred, masked Eyes:     Extraocular Movements: Extraocular movements intact.     Conjunctiva/sclera: Conjunctivae normal.     Pupils: Pupils are equal, round, and reactive to light.  Cardiovascular:     Rate and Rhythm: Normal rate and regular rhythm.     Pulses: Normal pulses.     Heart sounds: Normal heart sounds.  Pulmonary:     Effort: Pulmonary effort is normal.     Breath sounds: Normal  breath sounds.  Chest:     Breasts: Tanner Score is 5.        Right: Normal.        Left: Normal.  Abdominal:     General: Abdomen is flat. Bowel sounds are normal.     Palpations: Abdomen is soft.  Genitourinary:    Comments: deferred Musculoskeletal:        General: Normal range of motion.     Cervical back: Normal range of motion and neck supple.  Skin:    General: Skin is warm and dry.  Neurological:     General: No focal deficit present.     Mental Status: She is alert and oriented to person, place, and time.   Psychiatric:        Mood and Affect: Mood normal.        Behavior: Behavior normal.         Assessment And Plan:     1. Routine general medical examination at health care facility  A full exam was performed.  Importance of monthly self breast exams was discussed with the patient. PATIENT HAS BEEN ADVISED TO GET 30-45 MINUTES REGULAR EXERCISE NO LESS THAN FOUR TO FIVE DAYS PER WEEK - BOTH WEIGHTBEARING EXERCISES AND AEROBIC ARE RECOMMENDED.  SHE IS ADVISED TO FOLLOW A HEALTHY DIET WITH AT LEAST SIX FRUITS/VEGGIES PER DAY, DECREASE INTAKE OF RED MEAT, AND TO INCREASE FISH INTAKE TO TWO DAYS PER WEEK.  MEATS/FISH SHOULD NOT BE FRIED, BAKED OR BROILED IS PREFERABLE.  I SUGGEST WEARING SPF 50 SUNSCREEN ON EXPOSED PARTS AND ESPECIALLY WHEN IN THE DIRECT SUNLIGHT FOR AN EXTENDED PERIOD OF TIME.  PLEASE AVOID FAST FOOD RESTAURANTS AND INCREASE YOUR WATER INTAKE.  - POCT Urinalysis Dipstick (81002) - CMP14+EGFR - CBC - Lipid panel   Maximino Greenland, MD    THE PATIENT IS ENCOURAGED TO PRACTICE SOCIAL DISTANCING DUE TO THE COVID-19 PANDEMIC.

## 2019-06-26 NOTE — Patient Instructions (Signed)
Health Maintenance, Female Adopting a healthy lifestyle and getting preventive care are important in promoting health and wellness. Ask your health care provider about:  The right schedule for you to have regular tests and exams.  Things you can do on your own to prevent diseases and keep yourself healthy. What should I know about diet, weight, and exercise? Eat a healthy diet   Eat a diet that includes plenty of vegetables, fruits, low-fat dairy products, and lean protein.  Do not eat a lot of foods that are high in solid fats, added sugars, or sodium. Maintain a healthy weight Body mass index (BMI) is used to identify weight problems. It estimates body fat based on height and weight. Your health care provider can help determine your BMI and help you achieve or maintain a healthy weight. Get regular exercise Get regular exercise. This is one of the most important things you can do for your health. Most adults should:  Exercise for at least 150 minutes each week. The exercise should increase your heart rate and make you sweat (moderate-intensity exercise).  Do strengthening exercises at least twice a week. This is in addition to the moderate-intensity exercise.  Spend less time sitting. Even light physical activity can be beneficial. Watch cholesterol and blood lipids Have your blood tested for lipids and cholesterol at 57 years of age, then have this test every 5 years. Have your cholesterol levels checked more often if:  Your lipid or cholesterol levels are high.  You are older than 57 years of age.  You are at high risk for heart disease. What should I know about cancer screening? Depending on your health history and family history, you may need to have cancer screening at various ages. This may include screening for:  Breast cancer.  Cervical cancer.  Colorectal cancer.  Skin cancer.  Lung cancer. What should I know about heart disease, diabetes, and high blood  pressure? Blood pressure and heart disease  High blood pressure causes heart disease and increases the risk of stroke. This is more likely to develop in people who have high blood pressure readings, are of African descent, or are overweight.  Have your blood pressure checked: ? Every 3-5 years if you are 18-39 years of age. ? Every year if you are 40 years old or older. Diabetes Have regular diabetes screenings. This checks your fasting blood sugar level. Have the screening done:  Once every three years after age 40 if you are at a normal weight and have a low risk for diabetes.  More often and at a younger age if you are overweight or have a high risk for diabetes. What should I know about preventing infection? Hepatitis B If you have a higher risk for hepatitis B, you should be screened for this virus. Talk with your health care provider to find out if you are at risk for hepatitis B infection. Hepatitis C Testing is recommended for:  Everyone born from 1945 through 1965.  Anyone with known risk factors for hepatitis C. Sexually transmitted infections (STIs)  Get screened for STIs, including gonorrhea and chlamydia, if: ? You are sexually active and are younger than 57 years of age. ? You are older than 57 years of age and your health care provider tells you that you are at risk for this type of infection. ? Your sexual activity has changed since you were last screened, and you are at increased risk for chlamydia or gonorrhea. Ask your health care provider if   you are at risk.  Ask your health care provider about whether you are at high risk for HIV. Your health care provider may recommend a prescription medicine to help prevent HIV infection. If you choose to take medicine to prevent HIV, you should first get tested for HIV. You should then be tested every 3 months for as long as you are taking the medicine. Pregnancy  If you are about to stop having your period (premenopausal) and  you may become pregnant, seek counseling before you get pregnant.  Take 400 to 800 micrograms (mcg) of folic acid every day if you become pregnant.  Ask for birth control (contraception) if you want to prevent pregnancy. Osteoporosis and menopause Osteoporosis is a disease in which the bones lose minerals and strength with aging. This can result in bone fractures. If you are 65 years old or older, or if you are at risk for osteoporosis and fractures, ask your health care provider if you should:  Be screened for bone loss.  Take a calcium or vitamin D supplement to lower your risk of fractures.  Be given hormone replacement therapy (HRT) to treat symptoms of menopause. Follow these instructions at home: Lifestyle  Do not use any products that contain nicotine or tobacco, such as cigarettes, e-cigarettes, and chewing tobacco. If you need help quitting, ask your health care provider.  Do not use street drugs.  Do not share needles.  Ask your health care provider for help if you need support or information about quitting drugs. Alcohol use  Do not drink alcohol if: ? Your health care provider tells you not to drink. ? You are pregnant, may be pregnant, or are planning to become pregnant.  If you drink alcohol: ? Limit how much you use to 0-1 drink a day. ? Limit intake if you are breastfeeding.  Be aware of how much alcohol is in your drink. In the [place] one drink equals one 12 oz bottle of beer (355 mL), one 5 oz glass of wine (148 mL), or one 1 oz glass of hard liquor (44 mL). General instructions  Schedule regular health, dental, and eye exams.  Stay current with your vaccines.  Tell your health care provider if: ? You often feel depressed. ? You have ever been abused or do not feel safe at home. Summary  Adopting a healthy lifestyle and getting preventive care are important in promoting health and wellness.  Follow your health care provider's instructions about healthy  diet, exercising, and getting tested or screened for diseases.  Follow your health care provider's instructions on monitoring your cholesterol and blood pressure. This information is not intended to replace advice given to you by your health care provider. Make sure you discuss any questions you have with your health care provider. Document Released: 01/04/2011 Document Revised: 06/14/2018 Document Reviewed: 06/14/2018 Elsevier Patient Education  2020 [employer]  

## 2019-06-27 LAB — LIPID PANEL
Chol/HDL Ratio: 3.5 ratio (ref 0.0–4.4)
Cholesterol, Total: 155 mg/dL (ref 100–199)
HDL: 44 mg/dL (ref >39)
LDL Chol Calc (NIH): 83 mg/dL (ref 0–99)
Triglycerides: 159 mg/dL — ABNORMAL HIGH (ref 0–149)
VLDL Cholesterol Cal: 28 mg/dL (ref 5–40)

## 2019-06-27 LAB — CMP14+EGFR
ALT: 11 IU/L (ref 0–32)
AST: 18 IU/L (ref 0–40)
Albumin/Globulin Ratio: 1.4 (ref 1.2–2.2)
Albumin: 4.1 g/dL (ref 3.8–4.9)
Alkaline Phosphatase: 76 IU/L (ref 39–117)
BUN/Creatinine Ratio: 7 — ABNORMAL LOW (ref 9–23)
BUN: 5 mg/dL — ABNORMAL LOW (ref 6–24)
Bilirubin Total: 0.3 mg/dL (ref 0.0–1.2)
CO2: 21 mmol/L (ref 20–29)
Calcium: 9.2 mg/dL (ref 8.7–10.2)
Chloride: 98 mmol/L (ref 96–106)
Creatinine, Ser: 0.72 mg/dL (ref 0.57–1.00)
GFR calc Af Amer: 108 mL/min/{1.73_m2} (ref >59)
GFR calc non Af Amer: 93 mL/min/{1.73_m2} (ref >59)
Globulin, Total: 3 g/dL (ref 1.5–4.5)
Glucose: 92 mg/dL (ref 65–99)
Potassium: 3.8 mmol/L (ref 3.5–5.2)
Sodium: 134 mmol/L (ref 134–144)
Total Protein: 7.1 g/dL (ref 6.0–8.5)

## 2019-06-27 LAB — CBC
Hematocrit: 39 % (ref 34.0–46.6)
Hemoglobin: 13.5 g/dL (ref 11.1–15.9)
MCH: 30.8 pg (ref 26.6–33.0)
MCHC: 34.6 g/dL (ref 31.5–35.7)
MCV: 89 fL (ref 79–97)
Platelets: 231 10*3/uL (ref 150–450)
RBC: 4.39 x10E6/uL (ref 3.77–5.28)
RDW: 11.8 % (ref 11.7–15.4)
WBC: 6.8 10*3/uL (ref 3.4–10.8)

## 2019-07-24 ENCOUNTER — Inpatient Hospital Stay: Payer: BC Managed Care – PPO | Attending: Gynecologic Oncology | Admitting: Gynecologic Oncology

## 2019-07-24 ENCOUNTER — Other Ambulatory Visit: Payer: Self-pay

## 2019-07-24 ENCOUNTER — Encounter: Payer: Self-pay | Admitting: Gynecologic Oncology

## 2019-07-24 VITALS — BP 111/56 | HR 71 | Temp 97.3°F | Resp 16 | Ht 60.0 in | Wt 130.4 lb

## 2019-07-24 DIAGNOSIS — Z90710 Acquired absence of both cervix and uterus: Secondary | ICD-10-CM | POA: Diagnosis not present

## 2019-07-24 DIAGNOSIS — C541 Malignant neoplasm of endometrium: Secondary | ICD-10-CM | POA: Diagnosis not present

## 2019-07-24 DIAGNOSIS — Z90722 Acquired absence of ovaries, bilateral: Secondary | ICD-10-CM | POA: Diagnosis not present

## 2019-07-24 NOTE — Progress Notes (Signed)
Follow-up Note: Gyn-Onc   Mallory Hughes 59 y.o. female  Chief Complaint  Patient presents with  . 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   Plan: Pathology revealed low risk factors for recurrence, therefore no adjuvant therapy was recommended according to NCCN guidelines.  I discussed risk for recurrence and typical symptoms encouraged her to notify us of these should they develop between visits.  I recommend she continue follow-up every 6 months for 5 years in accordance with NCCN guidelines. Those visits should include symptom assessment, physical exam and pelvic examination. Pap smears are not indicated or recommended in the routine surveillance of endometrial cancer.  She will follow-up with Dr Cletis Media annually with her next appointment in July, 2019. I will see her 6 months later in January 2021.  HPI: 59 year old married female seen in consultation at the request of Dr. Butler Denmark regarding management of a newly diagnosed endometrial cancer. The patient presented with postmenopausal bleeding and underwent ultrasound showing a 24 mm endometrial stripe. Endometrial biopsy was obtained on 05/14/2016 revealing an endometrial adenocarcinoma appearing to be well-differentiated (grade 1) the patient denies any pain or any other gynecologic history.  The patient underwent a robotic assisted total hysterectomy, BSO, SLN biopsy on 06/15/16. Surgery was uncomplicated. Final pathology revealed a stage IA grade 1 endometrial cancer with a 3.7cm tumor, no myometrial invasion and no LVSI. The SLN's were negative. She was felt to have low risk factors for recurrence in accordance with NCCN guidelines no adjuvant therapy was recommended.  She saw Dr Cletis Media for follow-up in July, 2019 and a polyp at the vagina cuff was identified and removed. Pathology confirmed benign granulation  tissue.  Interval Hx:  She denies vaginal bleeding, pain or symptoms concerning for recurrence.  Review of Systems:10 point review of systems is negative except as noted in interval history.   Vitals: Blood pressure (!) 111/56, pulse 71, temperature (!) 97.3 F (36.3 C), temperature source Temporal, resp. rate 16, height 5' (1.524 m), weight 130 lb 6 oz (59.1 kg), last menstrual period 01/10/2016, SpO2 100 %.  Physical Exam: General : The patient is a healthy woman in no acute distress.  HEENT: normocephalic, extraoccular movements normal; neck is supple without thyromegally  Lynphnodes: Supraclavicular and inguinal nodes not enlarged  Abdomen: Soft, non-tender, no ascites, no organomegally, no masses, no hernias. Incisions soft. Pelvic:  EGBUS: Normal female  Vagina: Normal, no lesions, vaginal cuff in tact. Bi-manual examination: Non-tender; no adenxal masses or nodularity  Rectal: normal sphincter tone, no masses, no blood  Lower extremities: No edema or varicosities. Normal range of motion    No Known Allergies  History reviewed. No pertinent past medical history.  Past Surgical History:  Procedure Laterality Date  . LYMPH NODE DISSECTION N/A 06/15/2016   Procedure: BILATERAL SENTINAL LYMPH NODE DISSECTION;  Surgeon: Everitt Amber, MD;  Location: [facility];  Service: Gynecology;  Laterality: N/A;  . ROBOTIC ASSISTED LAP VAGINAL HYSTERECTOMY N/A 06/15/2016   Procedure: XI ROBOTIC ASSISTED LAPAROSCOPIC VAGINAL HYSTERECTOMY;  Surgeon: Everitt Amber, MD;  Location: [facility];  Service: Gynecology;  Laterality: N/A;  . ROBOTIC ASSISTED SALPINGO OOPHERECTOMY Bilateral 06/15/2016   Procedure: XI ROBOTIC ASSISTED BILATERAL SALPINGO OOPHORECTOMY;  Surgeon: Everitt Amber, MD;  Location: [facility];  Service: Gynecology;  Laterality: Bilateral;    Current Outpatient Medications  Medication Sig Dispense Refill  . cholecalciferol (VITAMIN D) 1000 units tablet Take 1,000 Units  by mouth daily. Patient is  taking 2 tabs daily.    . Multiple Vitamin (MULTIVITAMIN) capsule Take 1 capsule by mouth daily.     No current facility-administered medications for this visit.    Social History   Socioeconomic History  . Marital status: Married    Spouse name: Not on file  . Number of children: Not on file  . Years of education: Not on file  . Highest education level: Not on file  Occupational History  . Not on file  Tobacco Use  . Smoking status: Never Smoker  . Smokeless tobacco: Never Used  Substance and Sexual Activity  . Alcohol use: No  . Drug use: No  . Sexual activity: Yes  Other Topics Concern  . Not on file  Social History Narrative  . Not on file   Social Determinants of Health   Financial Resource Strain:   . Difficulty of Paying Living Expenses: Not on file  Food Insecurity:   . Worried About Charity fundraiser in the Last Year: Not on file  . Ran Out of Food in the Last Year: Not on file  Transportation Needs:   . Lack of Transportation (Medical): Not on file  . Lack of Transportation (Non-Medical): Not on file  Physical Activity:   . Days of Exercise per Week: Not on file  . Minutes of Exercise per Session: Not on file  Stress:   . Feeling of Stress : Not on file  Social Connections:   . Frequency of Communication with Friends and Family: Not on file  . Frequency of Social Gatherings with Friends and Family: Not on file  . Attends Religious Services: Not on file  . Active Member of Clubs or Organizations: Not on file  . Attends Archivist Meetings: Not on file  . Marital Status: Not on file  Intimate Partner Violence:   . Fear of Current or Ex-Partner: Not on file  . Emotionally Abused: Not on file  . Physically Abused: Not on file  . Sexually Abused: Not on file    Family History  Problem Relation Age of Onset  . Healthy Mother   . Healthy Father      Thereasa Solo, MD 07/24/2019, 4:41 PM

## 2019-07-24 NOTE — Patient Instructions (Signed)
Please notify Dr Denman George at phone number [phone] if you notice vaginal bleeding, new pelvic or abdominal pains, bloating, feeling full easy, or a change in bladder or bowel function.   Please contact Dr Serita Grit office (at [phone]) in October to request an appointment with her for January, 2022.  Please return to see Dr Cletis Media in the summer.

## 2020-01-04 ENCOUNTER — Other Ambulatory Visit: Payer: Self-pay | Admitting: Internal Medicine

## 2020-01-04 DIAGNOSIS — Z1231 Encounter for screening mammogram for malignant neoplasm of breast: Secondary | ICD-10-CM

## 2020-01-28 ENCOUNTER — Ambulatory Visit
Admission: RE | Admit: 2020-01-28 | Discharge: 2020-01-28 | Disposition: A | Discharge status: Home / Self Care | Payer: BC Managed Care – PPO | Source: Ambulatory Visit | Attending: Internal Medicine | Admitting: Internal Medicine

## 2020-01-28 ENCOUNTER — Other Ambulatory Visit: Payer: Self-pay

## 2020-01-28 DIAGNOSIS — Z1231 Encounter for screening mammogram for malignant neoplasm of breast: Secondary | ICD-10-CM

## 2020-03-13 ENCOUNTER — Encounter: Payer: Self-pay | Admitting: Cardiovascular Disease

## 2020-03-13 ENCOUNTER — Other Ambulatory Visit: Payer: Self-pay

## 2020-03-13 ENCOUNTER — Ambulatory Visit (INDEPENDENT_AMBULATORY_CARE_PROVIDER_SITE_OTHER): Payer: BC Managed Care – PPO | Admitting: Cardiovascular Disease

## 2020-03-13 VITALS — BP 113/60 | HR 60 | Ht 60.0 in | Wt 132.6 lb

## 2020-03-13 DIAGNOSIS — E785 Hyperlipidemia, unspecified: Secondary | ICD-10-CM | POA: Diagnosis not present

## 2020-03-13 DIAGNOSIS — I499 Cardiac arrhythmia, unspecified: Secondary | ICD-10-CM | POA: Diagnosis not present

## 2020-03-13 NOTE — Progress Notes (Signed)
Cardiology Office Note:    Date:  03/13/2020   ID:  Mallory Hughes, DOB 29-Jun-1961, MRN [id number]  PCP:  Glendale Chard, MD  [facility] Cardiologist:  No primary care provider on file. [facility] Electrophysiologist:  None   Referring MD: Delsa Bern, MD   No chief complaint on file. Mallory Hughes is a 59 y.o. female who is being seen today for the evaluation of irregular heart rhythm at the request of Rivard, Katharine Look, MD.   History of Present Illness:    Mallory Hughes is a 59 y.o. female with a hx of adenocarcinoma of the uterus, recently seen in follow-up in Dr. Boyd Kerbs clinic and found to have an irregular heart rhythm.  The patient reports that she was not aware of any palpitations at that time.  She denies any problems with palpitations since then or before.  She has not had dizziness or syncope.  She denies exertional angina or shortness of breath.  She goes for 5 mile walk several times a week without difficulty.  She does not have a known history of structural heart disease and does not have conventional risk factors such as hypertension, diabetes or hyperlipidemia or smoking.  She is only very mildly high overweight.  It has been almost 4 years since her hysterectomy and bilateral salpingo-oophorectomy and to date she has no evidence of disease.  There is no family history of premature cardiac death or sudden unexpected death, no family history of CAD or CHF.  She does not take any prescription medications and only takes vitamin D supplements.  She's generally considered herself to be in very good health.  She is accompanied today by her husband.  They are originally from [place].  No past medical history on file.  Past Surgical History:  Procedure Laterality Date   LYMPH NODE DISSECTION N/A 06/15/2016   Procedure: BILATERAL SENTINAL LYMPH NODE DISSECTION;  Surgeon: Everitt Amber, MD;  Location: [facility];  Service: Gynecology;  Laterality: N/A;    ROBOTIC ASSISTED LAP VAGINAL HYSTERECTOMY N/A 06/15/2016   Procedure: XI ROBOTIC ASSISTED LAPAROSCOPIC VAGINAL HYSTERECTOMY;  Surgeon: Everitt Amber, MD;  Location: [facility];  Service: Gynecology;  Laterality: N/A;   ROBOTIC ASSISTED SALPINGO OOPHERECTOMY Bilateral 06/15/2016   Procedure: XI ROBOTIC ASSISTED BILATERAL SALPINGO OOPHORECTOMY;  Surgeon: Everitt Amber, MD;  Location: [facility];  Service: Gynecology;  Laterality: Bilateral;    Current Medications: Current Meds  Medication Sig   cholecalciferol (VITAMIN D) 1000 units tablet Take 1,000 Units by mouth daily. Patient is taking 2 tabs daily.   Multiple Vitamin (MULTIVITAMIN) capsule Take 1 capsule by mouth daily.     Allergies:   Patient has no known allergies.   Social History   Socioeconomic History   Marital status: Married    Spouse name: Not on file   Number of children: Not on file   Years of education: Not on file   Highest education level: Not on file  Occupational History   Not on file  Tobacco Use   Smoking status: Never Smoker   Smokeless tobacco: Never Used  Vaping Use   Vaping Use: Never used  Substance and Sexual Activity   Alcohol use: No   Drug use: No   Sexual activity: Yes  Other Topics Concern   Not on file  Social History Narrative   Not on file   Social Determinants of Health   Financial Resource Strain:    Difficulty of Paying Living Expenses: Not on file  Food  Insecurity:    Worried About Charity fundraiser in the Last Year: Not on file   [employer] of Food in the Last Year: Not on file  Transportation Needs:    Lack of Transportation (Medical): Not on file   Lack of Transportation (Non-Medical): Not on file  Physical Activity:    Days of Exercise per Week: Not on file   Minutes of Exercise per Session: Not on file  Stress:    Feeling of Stress : Not on file  Social Connections:    Frequency of Communication with Friends and Family: Not on file   Frequency of Social  Gatherings with Friends and Family: Not on file   Attends Religious Services: Not on file   Active Member of Clubs or Organizations: Not on file   Attends Archivist Meetings: Not on file   Marital Status: Not on file     Family History: The patient's family history includes Healthy in her father and mother.  ROS:   Please see the history of present illness.     All other systems reviewed and are negative.  EKGs/Labs/Other Studies Reviewed:    The following studies were reviewed today: Notes from Dr. Cletis Media  EKG:  EKG is  ordered today.  The ekg ordered today demonstrates normal sinus rhythm, normal tracing  Recent Labs: 06/26/2019: ALT 11; BUN 5; Creatinine, Ser 0.72; Hemoglobin 13.5; Platelets 231; Potassium 3.8; Sodium 134  Recent Lipid Panel    Component Value Date/Time   CHOL 155 06/26/2019 0951   TRIG 159 (H) 06/26/2019 0951   HDL 44 06/26/2019 0951   CHOLHDL 3.5 06/26/2019 0951   LDLCALC 83 06/26/2019 0951    Physical Exam:    VS:  BP 113/60    Pulse 60    Ht 5' (1.524 m)    Wt 132 lb 9.6 oz (60.1 kg)    LMP 01/10/2016 (Approximate)    SpO2 99%    BMI 25.90 kg/m     Wt Readings from Last 3 Encounters:  03/13/20 132 lb 9.6 oz (60.1 kg)  07/24/19 130 lb 6 oz (59.1 kg)  06/26/19 128 lb 6.4 oz (58.2 kg)     GEN:  Well nourished, well developed in no acute distress HEENT: Normal NECK: No JVD; No carotid bruits LYMPHATICS: No lymphadenopathy CARDIAC: RRR, no murmurs, rubs, gallops RESPIRATORY:  Clear to auscultation without rales, wheezing or rhonchi  ABDOMEN: Soft, non-tender, non-distended MUSCULOSKELETAL:  No edema; No deformity  SKIN: Warm and dry NEUROLOGIC:  Alert and oriented x 3 PSYCHIATRIC:  Normal affect   ASSESSMENT:    1. Irregular heartbeat   2. Dyslipidemia    PLAN:    In order of problems listed above:  While I do not doubt that she had an irregular rhythm during her exam, this was completely asymptomatic.  She does not  have any evidence of structural heart disease by ECG or physical exam and has excellent functional status.  She does not have a personal history of syncope or her heart maintenance and has virtually no coronary risk factors (slightly low HDL, borderline hypertriglyceridemia, otherwise  good lipid profile).  At this point I do not think that aggressive further evaluation is indicated.  Suggested purchasing a commercially available rhythm monitor such as an apple watch or similar device.  This could potentially detect asymptomatic arrhythmia.  She can then send me any electrical tracings through [email or web address] and we can discuss whether more detailed evaluation is indicated.  I  would also like to hear back from her if she develops unexplained dizziness or syncope or other cardiac complaints.  Encouraged her to remain physically active and try to remain clean since this can help improve her triglyceride and HDL values, likely a sign of insulin resistance/metabolic syndrome, unfortunately a metabolic trait in [place].   Medication Adjustments/Labs and Tests Ordered: Current medicines are reviewed at length with the patient today.  Concerns regarding medicines are outlined above.  Orders Placed This Encounter  Procedures   EKG 12-Lead   No orders of the defined types were placed in this encounter.   Patient Instructions  Medication Instructions:  No changes *If you need a refill on your cardiac medications before your next appointment, please call your pharmacy*   Lab Work: None ordered If you have labs (blood work) drawn today and your tests are completely normal, you will receive your results only by:  [place] (if you have MyChart) OR  A paper copy in the mail If you have any lab test that is abnormal or we need to change your treatment, we will call you to review the results.   Testing/Procedures: None ordered   Follow-Up: At [facility], you and your health needs  are our priority.  As part of our continuing mission to provide you with exceptional heart care, we have created designated Provider Care Teams.  These Care Teams include your primary Cardiologist (physician) and Advanced Practice Providers (APPs -  Physician Assistants and Nurse Practitioners) who all work together to provide you with the care you need, when you need it.  We recommend signing up for the patient portal called "MyChart".  Sign up information is provided on this After Visit Summary.  MyChart is used to connect with patients for Virtual Visits (Telemedicine).  Patients are able to view lab/test results, encounter notes, upcoming appointments, etc.  Non-urgent messages can be sent to your provider as well.   To learn more about what you can do with MyChart, go to [email or web address].    Your next appointment:   Follow as needed with Dr. Sallyanne Kuster    Signed, Sanda Klein, MD  03/13/2020 4:28 PM    [place]

## 2020-03-13 NOTE — Patient Instructions (Signed)
Medication Instructions:  No changes *If you need a refill on your cardiac medications before your next appointment, please call your pharmacy*   Lab Work: None ordered If you have labs (blood work) drawn today and your tests are completely normal, you will receive your results only by: . MyChart Message (if you have MyChart) OR . A paper copy in the mail If you have any lab test that is abnormal or we need to change your treatment, we will call you to review the results.   Testing/Procedures: None ordered   Follow-Up: At [facility], you and your health needs are our priority.  As part of our continuing mission to provide you with exceptional heart care, we have created designated Provider Care Teams.  These Care Teams include your primary Cardiologist (physician) and Advanced Practice Providers (APPs -  Physician Assistants and Nurse Practitioners) who all work together to provide you with the care you need, when you need it.  We recommend signing up for the patient portal called "MyChart".  Sign up information is provided on this After Visit Summary.  MyChart is used to connect with patients for Virtual Visits (Telemedicine).  Patients are able to view lab/test results, encounter notes, upcoming appointments, etc.  Non-urgent messages can be sent to your provider as well.   To learn more about what you can do with MyChart, go to [email or web address].    Your next appointment:   Follow as needed with Dr. Croitoru  

## 2020-04-08 ENCOUNTER — Encounter: Payer: Self-pay | Admitting: Internal Medicine

## 2020-04-17 ENCOUNTER — Encounter: Payer: Self-pay | Admitting: Internal Medicine

## 2020-05-01 ENCOUNTER — Encounter: Payer: Self-pay | Admitting: Internal Medicine

## 2020-06-18 ENCOUNTER — Encounter: Payer: Self-pay | Admitting: Gynecologic Oncology

## 2020-06-18 ENCOUNTER — Telehealth: Payer: Self-pay | Admitting: *Deleted

## 2020-06-18 NOTE — Telephone Encounter (Signed)
Returned the patient's husband's call and scheduled an appt on 1/25

## 2020-07-10 ENCOUNTER — Encounter: Payer: Self-pay | Admitting: Internal Medicine

## 2020-07-10 ENCOUNTER — Ambulatory Visit (INDEPENDENT_AMBULATORY_CARE_PROVIDER_SITE_OTHER): Payer: BC Managed Care – PPO | Admitting: Internal Medicine

## 2020-07-10 ENCOUNTER — Other Ambulatory Visit: Payer: Self-pay

## 2020-07-10 VITALS — BP 118/68 | HR 66 | Temp 98.0°F | Ht 60.0 in | Wt 134.6 lb

## 2020-07-10 DIAGNOSIS — Z23 Encounter for immunization: Secondary | ICD-10-CM | POA: Diagnosis not present

## 2020-07-10 DIAGNOSIS — Z6826 Body mass index (BMI) 26.0-26.9, adult: Secondary | ICD-10-CM | POA: Diagnosis not present

## 2020-07-10 DIAGNOSIS — Z0000 Encounter for general adult medical examination without abnormal findings: Secondary | ICD-10-CM

## 2020-07-10 MED ORDER — TETANUS-DIPHTH-ACELL PERTUSSIS 5-2.5-18.5 LF-MCG/0.5 IM SUSY
0.5000 mL | PREFILLED_SYRINGE | Freq: Once | INTRAMUSCULAR | Status: AC
  Administered 2020-07-10: 0.5 mL via INTRAMUSCULAR

## 2020-07-10 NOTE — Progress Notes (Signed)
Rutherford Nail as a scribe for Maximino Greenland, MD.,have documented all relevant documentation on the behalf of Maximino Greenland, MD,as directed by  Maximino Greenland, MD while in the presence of Maximino Greenland, MD. This visit occurred during the SARS-CoV-2 public health emergency.  Safety protocols were in place, including screening questions prior to the visit, additional usage of staff PPE, and extensive cleaning of exam room while observing appropriate contact time as indicated for disinfecting solutions.  Subjective:     Patient ID: Mallory Hughes , female    DOB: [birth year] , 60 y.o.   MRN: [id number]   Chief Complaint  Patient presents with  . Annual Exam    HPI  She is here today for a full physical examination. She is followed by Dr. Cletis Media for her GYN exams.  Since her last visit, she was evaluated by Cardiology because Dr. Cletis Media noticed irregular heartbeats during her exam. Pt is without symptoms, she had nl ekg - no further workup recommended. She denies palpitations, dizziness and syncope.     Past Medical History:  Diagnosis Date  . Endometrial cancer ([place])      Family History  Problem Relation Age of Onset  . Healthy Mother   . Healthy Father      Current Outpatient Medications:  .  cholecalciferol (VITAMIN D) 1000 units tablet, Take 1,000 Units by mouth daily. Patient is taking 2 tabs daily.  Patient takes Mon-Fri 2000 Units, Disp: , Rfl:  .  Multiple Vitamin (MULTIVITAMIN) capsule, Take 1 capsule by mouth daily., Disp: , Rfl:    No Known Allergies    The patient states she uses status post hysterectomy for birth control. Last LMP was Patient's last menstrual period was 01/10/2016 (approximate).. Negative for Dysmenorrhea. Negative for: breast discharge, breast lump(s), breast pain and breast self exam. Associated symptoms include abnormal vaginal bleeding. Pertinent negatives include abnormal bleeding (hematology), anxiety, decreased libido,  depression, difficulty falling sleep, dyspareunia, history of infertility, nocturia, sexual dysfunction, sleep disturbances, urinary incontinence, urinary urgency, vaginal discharge and vaginal itching. Diet regular.The patient states her exercise level is  moderate.  . The patient's tobacco use is:  Social History   Tobacco Use  Smoking Status Never Smoker  Smokeless Tobacco Never Used  . She has been exposed to passive smoke. The patient's alcohol use is:  Social History   Substance and Sexual Activity  Alcohol Use No    Review of Systems  Constitutional: Negative.  Negative for fatigue.  HENT: Negative.   Eyes: Negative.   Respiratory: Negative.   Cardiovascular: Negative.   Endocrine: Negative.  Negative for polydipsia, polyphagia and polyuria.  Genitourinary: Negative.   Musculoskeletal: Negative.   Skin: Negative.   Allergic/Immunologic: Negative.   Neurological: Negative.  Negative for dizziness and headaches.  Hematological: Negative.   Psychiatric/Behavioral: Negative.      Today's Vitals   07/10/20 0930  BP: 118/68  Pulse: 66  Temp: 98 F (36.7 C)  TempSrc: Oral  Weight: 134 lb 9.6 oz (61.1 kg)  Height: 5' (1.524 m)   Body mass index is 26.29 kg/m.  Wt Readings from Last 3 Encounters:  07/10/20 134 lb 9.6 oz (61.1 kg)  03/13/20 132 lb 9.6 oz (60.1 kg)  07/24/19 130 lb 6 oz (59.1 kg)   Objective:  Physical Exam Vitals and nursing note reviewed.  Constitutional:      Appearance: Normal appearance.  HENT:     Head: Normocephalic and atraumatic.     Right  Ear: Tympanic membrane, ear canal and external ear normal.     Left Ear: Tympanic membrane, ear canal and external ear normal.     Nose:     Comments: Deferred, masked    Mouth/Throat:     Comments: Deferred, masked Eyes:     Extraocular Movements: Extraocular movements intact.     Conjunctiva/sclera: Conjunctivae normal.     Pupils: Pupils are equal, round, and reactive to light.   Cardiovascular:     Rate and Rhythm: Normal rate and regular rhythm.     Pulses: Normal pulses.     Heart sounds: Normal heart sounds.  Pulmonary:     Effort: Pulmonary effort is normal.     Breath sounds: Normal breath sounds.  Chest:  Breasts:     Tanner Score is 5.     Right: Normal.     Left: Normal.    Abdominal:     General: Abdomen is flat. Bowel sounds are normal.     Palpations: Abdomen is soft.  Genitourinary:    Comments: deferred Musculoskeletal:        General: Normal range of motion.     Cervical back: Normal range of motion and neck supple.  Skin:    General: Skin is warm and dry.  Neurological:     General: No focal deficit present.     Mental Status: She is alert and oriented to person, place, and time.  Psychiatric:        Mood and Affect: Mood normal.        Behavior: Behavior normal.         Assessment And Plan:     1. Routine general medical examination at health care facility Comments: A full exam was performed. Importance of monthly self breast exams was discussed with the patient.  She will c/w GYN care with Dr. Cletis Media.  PATIENT IS ADVISED TO GET 30-45 MINUTES REGULAR EXERCISE NO LESS THAN FOUR TO FIVE DAYS PER WEEK - BOTH WEIGHTBEARING EXERCISES AND AEROBIC ARE RECOMMENDED.  PATIENT IS ADVISED TO FOLLOW A HEALTHY DIET WITH AT LEAST SIX FRUITS/VEGGIES PER DAY, DECREASE INTAKE OF RED MEAT, AND TO INCREASE FISH INTAKE TO TWO DAYS PER WEEK.  MEATS/FISH SHOULD NOT BE FRIED, BAKED OR BROILED IS PREFERABLE.  I SUGGEST WEARING SPF 50 SUNSCREEN ON EXPOSED PARTS AND ESPECIALLY WHEN IN THE DIRECT SUNLIGHT FOR AN EXTENDED PERIOD OF TIME.  PLEASE AVOID FAST FOOD RESTAURANTS AND INCREASE YOUR WATER INTAKE.  - CMP14+EGFR - VITAMIN D 25 Hydroxy (Vit-D Deficiency, Fractures) - CBC - Lipid panel - TSH  2. Adult BMI 26.0-26.9 kg/sq m Comments: She is aware of 4 pound weight gain since Jan 2021. She thinks it is due to holiday eating. She will c/w regular exercise  regimen, will also benefit from strength training routine.   3. Immunization due Comments: She was given Boostrix to update her immunization history.  - Tdap (BOOSTRIX) injection 0.5 mL  Patient was given opportunity to ask questions. Patient verbalized understanding of the plan and was able to repeat key elements of the plan. All questions were answered to their satisfaction.  Maximino Greenland, MD   I, Maximino Greenland, MD, have reviewed all documentation for this visit. The documentation on 07/10/20 for the exam, diagnosis, procedures, and orders are all accurate and complete.  THE PATIENT IS ENCOURAGED TO PRACTICE SOCIAL DISTANCING DUE TO THE COVID-19 PANDEMIC.

## 2020-07-10 NOTE — Patient Instructions (Addendum)
Calm, magnesium supplement One teaspoon at night  Health Maintenance, Female Adopting a healthy lifestyle and getting preventive care are important in promoting health and wellness. Ask your health care provider about:  The right schedule for you to have regular tests and exams.  Things you can do on your own to prevent diseases and keep yourself healthy. What should I know about diet, weight, and exercise? Eat a healthy diet   Eat a diet that includes plenty of vegetables, fruits, low-fat dairy products, and lean protein.  Do not eat a lot of foods that are high in solid fats, added sugars, or sodium. Maintain a healthy weight Body mass index (BMI) is used to identify weight problems. It estimates body fat based on height and weight. Your health care provider can help determine your BMI and help you achieve or maintain a healthy weight. Get regular exercise Get regular exercise. This is one of the most important things you can do for your health. Most adults should:  Exercise for at least 150 minutes each week. The exercise should increase your heart rate and make you sweat (moderate-intensity exercise).  Do strengthening exercises at least twice a week. This is in addition to the moderate-intensity exercise.  Spend less time sitting. Even light physical activity can be beneficial. Watch cholesterol and blood lipids Have your blood tested for lipids and cholesterol at 59 years of age, then have this test every 5 years. Have your cholesterol levels checked more often if:  Your lipid or cholesterol levels are high.  You are older than 59 years of age.  You are at high risk for heart disease. What should I know about cancer screening? Depending on your health history and family history, you may need to have cancer screening at various ages. This may include screening for:  Breast cancer.  Cervical cancer.  Colorectal cancer.  Skin cancer.  Lung cancer. What should I know  about heart disease, diabetes, and high blood pressure? Blood pressure and heart disease  High blood pressure causes heart disease and increases the risk of stroke. This is more likely to develop in people who have high blood pressure readings, are of African descent, or are overweight.  Have your blood pressure checked: ? Every 3-5 years if you are 43-45 years of age. ? Every year if you are 70 years old or older. Diabetes Have regular diabetes screenings. This checks your fasting blood sugar level. Have the screening done:  Once every three years after age 53 if you are at a normal weight and have a low risk for diabetes.  More often and at a younger age if you are overweight or have a high risk for diabetes. What should I know about preventing infection? Hepatitis B If you have a higher risk for hepatitis B, you should be screened for this virus. Talk with your health care provider to find out if you are at risk for hepatitis B infection. Hepatitis C Testing is recommended for:  Everyone born from [birth year] through 1964.  Anyone with known risk factors for hepatitis C. Sexually transmitted infections (STIs)  Get screened for STIs, including gonorrhea and chlamydia, if: ? You are sexually active and are younger than 59 years of age. ? You are older than 59 years of age and your health care provider tells you that you are at risk for this type of infection. ? Your sexual activity has changed since you were last screened, and you are at increased risk for chlamydia  or gonorrhea. Ask your health care provider if you are at risk.  Ask your health care provider about whether you are at high risk for HIV. Your health care provider may recommend a prescription medicine to help prevent HIV infection. If you choose to take medicine to prevent HIV, you should first get tested for HIV. You should then be tested every 3 months for as long as you are taking the medicine. Pregnancy  If you are about  to stop having your period (premenopausal) and you may become pregnant, seek counseling before you get pregnant.  Take 400 to 800 micrograms (mcg) of folic acid every day if you become pregnant.  Ask for birth control (contraception) if you want to prevent pregnancy. Osteoporosis and menopause Osteoporosis is a disease in which the bones lose minerals and strength with aging. This can result in bone fractures. If you are 47 years old or older, or if you are at risk for osteoporosis and fractures, ask your health care provider if you should:  Be screened for bone loss.  Take a calcium or vitamin D supplement to lower your risk of fractures.  Be given hormone replacement therapy (HRT) to treat symptoms of menopause. Follow these instructions at home: Lifestyle  Do not use any products that contain nicotine or tobacco, such as cigarettes, e-cigarettes, and chewing tobacco. If you need help quitting, ask your health care provider.  Do not use street drugs.  Do not share needles.  Ask your health care provider for help if you need support or information about quitting drugs. Alcohol use  Do not drink alcohol if: ? Your health care provider tells you not to drink. ? You are pregnant, may be pregnant, or are planning to become pregnant.  If you drink alcohol: ? Limit how much you use to 0-1 drink a day. ? Limit intake if you are breastfeeding.  Be aware of how much alcohol is in your drink. In the [place] one drink equals one 12 oz bottle of beer (355 mL), one 5 oz glass of wine (148 mL), or one 1 oz glass of hard liquor (44 mL). General instructions  Schedule regular health, dental, and eye exams.  Stay current with your vaccines.  Tell your health care provider if: ? You often feel depressed. ? You have ever been abused or do not feel safe at home. Summary  Adopting a healthy lifestyle and getting preventive care are important in promoting health and wellness.  Follow your  health care provider's instructions about healthy diet, exercising, and getting tested or screened for diseases.  Follow your health care provider's instructions on monitoring your cholesterol and blood pressure. This information is not intended to replace advice given to you by your health care provider. Make sure you discuss any questions you have with your health care provider. Document Revised: 06/14/2018 Document Reviewed: 06/14/2018 Elsevier Patient Education  2020 [employer].

## 2020-07-11 LAB — CMP14+EGFR
ALT: 16 IU/L (ref 0–32)
AST: 20 IU/L (ref 0–40)
Albumin/Globulin Ratio: 1.5 (ref 1.2–2.2)
Albumin: 4.6 g/dL (ref 3.8–4.9)
Alkaline Phosphatase: 78 IU/L (ref 44–121)
BUN/Creatinine Ratio: 7 — ABNORMAL LOW (ref 9–23)
BUN: 5 mg/dL — ABNORMAL LOW (ref 6–24)
Bilirubin Total: 0.4 mg/dL (ref 0.0–1.2)
CO2: 23 mmol/L (ref 20–29)
Calcium: 9.5 mg/dL (ref 8.7–10.2)
Chloride: 98 mmol/L (ref 96–106)
Creatinine, Ser: 0.7 mg/dL (ref 0.57–1.00)
GFR calc Af Amer: 110 mL/min/{1.73_m2} (ref >59)
GFR calc non Af Amer: 96 mL/min/{1.73_m2} (ref >59)
Globulin, Total: 3 g/dL (ref 1.5–4.5)
Glucose: 100 mg/dL — ABNORMAL HIGH (ref 65–99)
Potassium: 3.7 mmol/L (ref 3.5–5.2)
Sodium: 137 mmol/L (ref 134–144)
Total Protein: 7.6 g/dL (ref 6.0–8.5)

## 2020-07-11 LAB — LIPID PANEL
Chol/HDL Ratio: 4 ratio (ref 0.0–4.4)
Cholesterol, Total: 165 mg/dL (ref 100–199)
HDL: 41 mg/dL (ref >39)
LDL Chol Calc (NIH): 93 mg/dL (ref 0–99)
Triglycerides: 177 mg/dL — ABNORMAL HIGH (ref 0–149)
VLDL Cholesterol Cal: 31 mg/dL (ref 5–40)

## 2020-07-11 LAB — CBC
Hematocrit: 38.7 % (ref 34.0–46.6)
Hemoglobin: 13.7 g/dL (ref 11.1–15.9)
MCH: 30.9 pg (ref 26.6–33.0)
MCHC: 35.4 g/dL (ref 31.5–35.7)
MCV: 87 fL (ref 79–97)
Platelets: 239 10*3/uL (ref 150–450)
RBC: 4.44 x10E6/uL (ref 3.77–5.28)
RDW: 12.5 % (ref 11.7–15.4)
WBC: 6.9 10*3/uL (ref 3.4–10.8)

## 2020-07-11 LAB — VITAMIN D 25 HYDROXY (VIT D DEFICIENCY, FRACTURES): Vit D, 25-Hydroxy: 33.7 ng/mL (ref 30.0–100.0)

## 2020-07-11 LAB — TSH: TSH: 3.08 u[IU]/mL (ref 0.450–4.500)

## 2020-07-29 ENCOUNTER — Other Ambulatory Visit: Payer: Self-pay

## 2020-07-29 ENCOUNTER — Encounter: Payer: Self-pay | Admitting: Gynecologic Oncology

## 2020-07-29 ENCOUNTER — Inpatient Hospital Stay: Payer: BC Managed Care – PPO | Attending: Gynecologic Oncology | Admitting: Gynecologic Oncology

## 2020-07-29 VITALS — BP 110/73 | HR 67 | Temp 96.8°F | Resp 18 | Wt 136.8 lb

## 2020-07-29 DIAGNOSIS — C541 Malignant neoplasm of endometrium: Secondary | ICD-10-CM | POA: Diagnosis not present

## 2020-07-29 DIAGNOSIS — Z90710 Acquired absence of both cervix and uterus: Secondary | ICD-10-CM | POA: Diagnosis not present

## 2020-07-29 DIAGNOSIS — Z90722 Acquired absence of ovaries, bilateral: Secondary | ICD-10-CM | POA: Insufficient documentation

## 2020-07-29 NOTE — Progress Notes (Signed)
Follow-up Note: Gyn-Onc   Mallory Hughes 59 y.o. female  Chief Complaint  Patient presents with  . Endometrial cancer [facility])    Assessment :Stage IA grade 1 endometrioid endometrial adenocarcinoma, s/p staging surgery on 06/15/16.   Plan: Pathology revealed low risk factors for recurrence, therefore no adjuvant therapy was recommended according to NCCN guidelines.  I discussed risk for recurrence and typical symptoms encouraged her to notify us of these should they develop between visits.  I recommend she continue follow-up every 6 months for 5 years in accordance with NCCN guidelines. Those visits should include symptom assessment, physical exam and pelvic examination. Pap smears are not indicated or recommended in the routine surveillance of endometrial cancer.  She will follow-up with Dr Cletis Media annually with her next appointment in July, 2022. I will see her 6 months later in January 2023 for her last surveillance visit.  HPI: 60 year old married female seen in consultation at the request of Dr. Butler Denmark regarding management of a newly diagnosed endometrial cancer. The patient presented with postmenopausal bleeding and underwent ultrasound showing a 24 mm endometrial stripe. Endometrial biopsy was obtained on 05/14/2016 revealing an endometrial adenocarcinoma appearing to be well-differentiated (grade 1) the patient denies any pain or any other gynecologic history.  The patient underwent a robotic assisted total hysterectomy, BSO, SLN biopsy on 06/15/16. Surgery was uncomplicated. Final pathology revealed a stage IA grade 1 endometrial cancer with a 3.7cm tumor, no myometrial invasion and no LVSI. The SLN's were negative. She was felt to have low risk factors for recurrence in accordance with NCCN guidelines no adjuvant therapy was recommended.  She saw Dr Cletis Media for follow-up in July, 2019 and a polyp at the vagina cuff was identified and removed. Pathology confirmed benign granulation  tissue.  Interval Hx:  She denies vaginal bleeding, pain or symptoms concerning for recurrence.  Review of Systems:10 point review of systems is negative except as noted in interval history.   Vitals: Blood pressure 110/73, pulse 67, temperature (!) 96.8 F (36 C), temperature source Tympanic, resp. rate 18, weight 136 lb 12.8 oz (62.1 kg), last menstrual period 01/10/2016, SpO2 100 %.  Physical Exam: General : The patient is a healthy woman in no acute distress.  HEENT: normocephalic, extraoccular movements normal; neck is supple without thyromegally  Lynphnodes: Supraclavicular and inguinal nodes not enlarged  Abdomen: Soft, non-tender, no ascites, no organomegally, no masses, no hernias. Incisions soft. Pelvic:  EGBUS: Normal female  Vagina: Normal, no lesions, vaginal cuff in tact. Bi-manual examination: Non-tender; no adenxal masses or nodularity  Rectal: normal sphincter tone, no masses, no blood  Lower extremities: No edema or varicosities. Normal range of motion    No Known Allergies  Past Medical History:  Diagnosis Date  . Endometrial cancer [facility])     Past Surgical History:  Procedure Laterality Date  . LYMPH NODE DISSECTION N/A 06/15/2016   Procedure: BILATERAL SENTINAL LYMPH NODE DISSECTION;  Surgeon: Everitt Amber, MD;  Location: [facility];  Service: Gynecology;  Laterality: N/A;  . ROBOTIC ASSISTED LAP VAGINAL HYSTERECTOMY N/A 06/15/2016   Procedure: XI ROBOTIC ASSISTED LAPAROSCOPIC VAGINAL HYSTERECTOMY;  Surgeon: Everitt Amber, MD;  Location: [facility];  Service: Gynecology;  Laterality: N/A;  . ROBOTIC ASSISTED SALPINGO OOPHERECTOMY Bilateral 06/15/2016   Procedure: XI ROBOTIC ASSISTED BILATERAL SALPINGO OOPHORECTOMY;  Surgeon: Everitt Amber, MD;  Location: [facility];  Service: Gynecology;  Laterality: Bilateral;    Current Outpatient Medications  Medication Sig Dispense Refill  . cholecalciferol (VITAMIN D) 1000 units tablet Take  1,000 Units by mouth daily. Patient is taking  2 tabs daily.  Patient takes Mon-Fri 2000 Units    . Multiple Vitamin (MULTIVITAMIN) capsule Take 1 capsule by mouth daily.     No current facility-administered medications for this visit.    Social History   Socioeconomic History  . Marital status: Married    Spouse name: Not on file  . Number of children: Not on file  . Years of education: Not on file  . Highest education level: Not on file  Occupational History  . Not on file  Tobacco Use  . Smoking status: Never Smoker  . Smokeless tobacco: Never Used  Vaping Use  . Vaping Use: Never used  Substance and Sexual Activity  . Alcohol use: No  . Drug use: No  . Sexual activity: Yes  Other Topics Concern  . Not on file  Social History Narrative  . Not on file   Social Determinants of Health   Financial Resource Strain: Not on file  Food Insecurity: Not on file  Transportation Needs: Not on file  Physical Activity: Not on file  Stress: Not on file  Social Connections: Not on file  Intimate Partner Violence: Not on file    Family History  Problem Relation Age of Onset  . Healthy Mother   . Healthy Father      Thereasa Solo, MD 07/29/2020, 4:15 PM

## 2020-07-29 NOTE — Patient Instructions (Signed)
Please notify Dr Denman George at phone number [phone] if you notice vaginal bleeding, new pelvic or abdominal pains, bloating, feeling full easy, or a change in bladder or bowel function.   Please contact Dr Serita Grit office (at [phone]) in July after your appointment with Dr Cletis Media to request an appointment with her for January, 2023 for your last check up. It is also ok to forgo that visit if you are doing well and have no concerning symptoms, since it will have been 5 years since your cancer surgery in December of 2022.  While you do not need scheduled visits with Dr Denman George after December, 2022, you can always call her office with questions or concerns should they arise and she will see you on an as-needed basis.

## 2020-12-17 ENCOUNTER — Other Ambulatory Visit: Payer: Self-pay | Admitting: Internal Medicine

## 2020-12-17 DIAGNOSIS — Z1231 Encounter for screening mammogram for malignant neoplasm of breast: Secondary | ICD-10-CM

## 2021-02-04 ENCOUNTER — Encounter: Payer: Self-pay | Admitting: Internal Medicine

## 2021-02-09 ENCOUNTER — Other Ambulatory Visit: Payer: Self-pay

## 2021-02-09 ENCOUNTER — Ambulatory Visit
Admission: RE | Admit: 2021-02-09 | Discharge: 2021-02-09 | Disposition: A | Discharge status: Home / Self Care | Payer: BC Managed Care – PPO | Source: Ambulatory Visit | Attending: Internal Medicine | Admitting: Internal Medicine

## 2021-02-09 DIAGNOSIS — Z1231 Encounter for screening mammogram for malignant neoplasm of breast: Secondary | ICD-10-CM

## 2021-07-29 ENCOUNTER — Other Ambulatory Visit: Payer: Self-pay

## 2021-07-29 ENCOUNTER — Ambulatory Visit (INDEPENDENT_AMBULATORY_CARE_PROVIDER_SITE_OTHER): Payer: BC Managed Care – PPO | Admitting: Internal Medicine

## 2021-07-29 ENCOUNTER — Encounter: Payer: BC Managed Care – PPO | Admitting: Internal Medicine

## 2021-07-29 ENCOUNTER — Encounter: Payer: Self-pay | Admitting: Internal Medicine

## 2021-07-29 VITALS — BP 112/80 | HR 59 | Temp 98.2°F | Ht 60.0 in | Wt 136.8 lb

## 2021-07-29 DIAGNOSIS — E2839 Other primary ovarian failure: Secondary | ICD-10-CM | POA: Diagnosis not present

## 2021-07-29 DIAGNOSIS — Z0000 Encounter for general adult medical examination without abnormal findings: Secondary | ICD-10-CM

## 2021-07-29 DIAGNOSIS — Z2821 Immunization not carried out because of patient refusal: Secondary | ICD-10-CM

## 2021-07-29 DIAGNOSIS — Z6826 Body mass index (BMI) 26.0-26.9, adult: Secondary | ICD-10-CM

## 2021-07-29 DIAGNOSIS — E559 Vitamin D deficiency, unspecified: Secondary | ICD-10-CM | POA: Diagnosis not present

## 2021-07-29 DIAGNOSIS — Z1211 Encounter for screening for malignant neoplasm of colon: Secondary | ICD-10-CM

## 2021-07-29 NOTE — Progress Notes (Signed)
Rich Brave [place]leton,acting as a Education administrator for Maximino Greenland, MD.,have documented all relevant documentation on the behalf of Maximino Greenland, MD,as directed by  Maximino Greenland, MD while in the presence of Maximino Greenland, MD.  This visit occurred during the SARS-CoV-2 public health emergency.  Safety protocols were in place, including screening questions prior to the visit, additional usage of staff PPE, and extensive cleaning of exam room while observing appropriate contact time as indicated for disinfecting solutions.  Subjective:     Patient ID: Mallory Hughes , female    DOB: 11-30-1960 , 61 y.o.   MRN: [id number]   Chief Complaint  Patient presents with   Annual Exam    HPI  She is here today for a full physical examination. She is followed by Dr. Cletis Media for her GYN exams.  She was last seen July 2022. She has no specific concerns or complaints. She is accompanied by her husband today. She does not wish to get Shingles vaccine, flu shot or COVID booster.     Past Medical History:  Diagnosis Date   Endometrial cancer ([place])      Family History  Problem Relation Age of Onset   Healthy Mother    Healthy Father      Current Outpatient Medications:    cholecalciferol (VITAMIN D) 1000 units tablet, Take 1,000 Units by mouth daily. Patient is taking 2 tabs daily.  Patient takes Mon-Fri 2000 Units, Disp: , Rfl:    Multiple Vitamin (MULTIVITAMIN) capsule, Take 1 capsule by mouth daily., Disp: , Rfl:    No Known Allergies    The patient states she uses status post hysterectomy for birth control. Last LMP was Patient's last menstrual period was 01/10/2016 (approximate).. Negative for Dysmenorrhea. Negative for: breast discharge, breast lump(s), breast pain and breast self exam. Associated symptoms include abnormal vaginal bleeding. Pertinent negatives include abnormal bleeding (hematology), anxiety, decreased libido, depression, difficulty falling sleep, dyspareunia, history of  infertility, nocturia, sexual dysfunction, sleep disturbances, urinary incontinence, urinary urgency, vaginal discharge and vaginal itching. Diet regular.The patient states her exercise level is moderate.  . The patient's tobacco use is:  Social History   Tobacco Use  Smoking Status Never  Smokeless Tobacco Never  . She has been exposed to passive smoke. The patient's alcohol use is:  Social History   Substance and Sexual Activity  Alcohol Use No   Review of Systems  Constitutional: Negative.   HENT: Negative.    Eyes: Negative.   Respiratory: Negative.    Cardiovascular: Negative.   Gastrointestinal: Negative.   Endocrine: Negative.   Genitourinary: Negative.   Musculoskeletal: Negative.   Skin: Negative.   Allergic/Immunologic: Negative.   Neurological: Negative.   Hematological: Negative.   Psychiatric/Behavioral: Negative.      Today's Vitals   07/29/21 0851  BP: 112/80  Pulse: (!) 59  Weight: 136 lb 12.8 oz (62.1 kg)  Height: 5' (1.524 m)  PainSc: 0-No pain   Body mass index is 26.72 kg/m.  Wt Readings from Last 3 Encounters:  07/29/21 136 lb 12.8 oz (62.1 kg)  07/29/20 136 lb 12.8 oz (62.1 kg)  07/10/20 134 lb 9.6 oz (61.1 kg)    Objective:  Physical Exam Vitals and nursing note reviewed.  Constitutional:      Appearance: Normal appearance.  HENT:     Head: Normocephalic and atraumatic.     Right Ear: Tympanic membrane, ear canal and external ear normal.     Left Ear: Tympanic membrane, ear  canal and external ear normal.     Nose:     Comments: Masked     Mouth/Throat:     Comments: Masked  Eyes:     Extraocular Movements: Extraocular movements intact.     Conjunctiva/sclera: Conjunctivae normal.     Pupils: Pupils are equal, round, and reactive to light.  Cardiovascular:     Rate and Rhythm: Normal rate and regular rhythm.     Pulses: Normal pulses.     Heart sounds: Normal heart sounds.  Pulmonary:     Effort: Pulmonary effort is normal.      Breath sounds: Normal breath sounds.  Chest:  Breasts:    Tanner Score is 5.     Right: Normal.     Left: Normal.  Abdominal:     General: Abdomen is flat. Bowel sounds are normal.     Palpations: Abdomen is soft.  Genitourinary:    Comments: deferred Musculoskeletal:        General: Normal range of motion.     Cervical back: Normal range of motion and neck supple.  Skin:    General: Skin is warm and dry.  Neurological:     General: No focal deficit present.     Mental Status: She is alert and oriented to person, place, and time.  Psychiatric:        Mood and Affect: Mood normal.        Behavior: Behavior normal.        Assessment And Plan:     1. Encounter for general adult medical examination w/o abnormal findings Comments: A full exam was performed. Importance of monthly self breast exams was discussed with the patient. She is up to date with mammogram. PATIENT IS ADVISED TO GET 30-45 MINUTES REGULAR EXERCISE NO LESS THAN FOUR TO FIVE DAYS PER WEEK - BOTH WEIGHTBEARING EXERCISES AND AEROBIC ARE RECOMMENDED.  PATIENT IS ADVISED TO FOLLOW A HEALTHY DIET WITH AT LEAST SIX FRUITS/VEGGIES PER DAY, DECREASE INTAKE OF RED MEAT, AND TO INCREASE FISH INTAKE TO TWO DAYS PER WEEK.  MEATS/FISH SHOULD NOT BE FRIED, BAKED OR BROILED IS PREFERABLE.  IT IS ALSO IMPORTANT TO CUT BACK ON YOUR SUGAR INTAKE. PLEASE AVOID ANYTHING WITH ADDED SUGAR, CORN SYRUP OR OTHER SWEETENERS. IF YOU MUST USE A SWEETENER, YOU CAN TRY STEVIA. IT IS ALSO IMPORTANT TO AVOID ARTIFICIALLY SWEETENERS AND DIET BEVERAGES. LASTLY, I SUGGEST WEARING SPF 50 SUNSCREEN ON EXPOSED PARTS AND ESPECIALLY WHEN IN THE DIRECT SUNLIGHT FOR AN EXTENDED PERIOD OF TIME.  PLEASE AVOID FAST FOOD RESTAURANTS AND INCREASE YOUR WATER INTAKE.  - CBC - CMP14+EGFR - Lipid panel  2. Estrogen deficiency Comments: I will send her for baseline bone density. Pt advised I will try to schedule at the [facility] - DG Bone Density; Future  3. Vitamin D deficiency Comments: I will check a vitamin D level and supplement as needed.  - Vitamin D (25 hydroxy)  4. Screen for colon cancer Comments: Last colonoscopy Oct 2013. I will refer her to Dr. Loreta Ave to be scheduled later this year.  - Ambulatory referral to Gastroenterology  5. BMI 26.0-26.9,adult Comments: She is encouraged to aim for at least 150 minutes of exercise per week.   6. Influenza vaccination declined  Patient was given opportunity to ask questions. Patient verbalized understanding of the plan and was able to repeat key elements of the plan. All questions were answered to their satisfaction.  I, Maximino Greenland, MD, have reviewed all documentation for this visit. The documentation on 07/29/21 for the exam, diagnosis, procedures, and orders are all accurate and complete.   THE PATIENT IS ENCOURAGED TO PRACTICE SOCIAL DISTANCING DUE TO THE COVID-19 PANDEMIC.

## 2021-07-29 NOTE — Patient Instructions (Signed)

## 2021-07-30 LAB — CMP14+EGFR
ALT: 14 IU/L (ref 0–32)
AST: 18 IU/L (ref 0–40)
Albumin/Globulin Ratio: 1.6 (ref 1.2–2.2)
Albumin: 4.5 g/dL (ref 3.8–4.9)
Alkaline Phosphatase: 79 IU/L (ref 44–121)
BUN/Creatinine Ratio: 7 — ABNORMAL LOW (ref 9–23)
BUN: 5 mg/dL — ABNORMAL LOW (ref 6–24)
Bilirubin Total: 0.3 mg/dL (ref 0.0–1.2)
CO2: 25 mmol/L (ref 20–29)
Calcium: 9.6 mg/dL (ref 8.7–10.2)
Chloride: 97 mmol/L (ref 96–106)
Creatinine, Ser: 0.72 mg/dL (ref 0.57–1.00)
Globulin, Total: 2.8 g/dL (ref 1.5–4.5)
Glucose: 94 mg/dL (ref 70–99)
Potassium: 4 mmol/L (ref 3.5–5.2)
Sodium: 137 mmol/L (ref 134–144)
Total Protein: 7.3 g/dL (ref 6.0–8.5)
eGFR: 96 mL/min/{1.73_m2} (ref >59)

## 2021-07-30 LAB — CBC
Hematocrit: 39.4 % (ref 34.0–46.6)
Hemoglobin: 13.6 g/dL (ref 11.1–15.9)
MCH: 30 pg (ref 26.6–33.0)
MCHC: 34.5 g/dL (ref 31.5–35.7)
MCV: 87 fL (ref 79–97)
Platelets: 232 10*3/uL (ref 150–450)
RBC: 4.54 x10E6/uL (ref 3.77–5.28)
RDW: 12 % (ref 11.7–15.4)
WBC: 6.4 10*3/uL (ref 3.4–10.8)

## 2021-07-30 LAB — LIPID PANEL
Chol/HDL Ratio: 3.9 ratio (ref 0.0–4.4)
Cholesterol, Total: 181 mg/dL (ref 100–199)
HDL: 46 mg/dL (ref >39)
LDL Chol Calc (NIH): 111 mg/dL — ABNORMAL HIGH (ref 0–99)
Triglycerides: 137 mg/dL (ref 0–149)
VLDL Cholesterol Cal: 24 mg/dL (ref 5–40)

## 2021-07-30 LAB — VITAMIN D 25 HYDROXY (VIT D DEFICIENCY, FRACTURES): Vit D, 25-Hydroxy: 38.9 ng/mL (ref 30.0–100.0)

## 2021-08-18 ENCOUNTER — Ambulatory Visit (INDEPENDENT_AMBULATORY_CARE_PROVIDER_SITE_OTHER): Payer: BC Managed Care – PPO

## 2021-08-18 ENCOUNTER — Other Ambulatory Visit: Payer: Self-pay

## 2021-08-18 VITALS — BP 118/80 | HR 72 | Temp 98.0°F | Ht 60.0 in | Wt 136.4 lb

## 2021-08-18 DIAGNOSIS — Z23 Encounter for immunization: Secondary | ICD-10-CM | POA: Diagnosis not present

## 2021-08-18 NOTE — Progress Notes (Signed)
Pt presents today for shingle vaccine.

## 2022-01-01 ENCOUNTER — Ambulatory Visit
Admission: RE | Admit: 2022-01-01 | Discharge: 2022-01-01 | Disposition: A | Discharge status: Home / Self Care | Payer: BC Managed Care – PPO | Source: Ambulatory Visit | Attending: Internal Medicine | Admitting: Internal Medicine

## 2022-01-01 DIAGNOSIS — E2839 Other primary ovarian failure: Secondary | ICD-10-CM

## 2022-01-07 ENCOUNTER — Other Ambulatory Visit: Payer: Self-pay | Admitting: Internal Medicine

## 2022-01-07 DIAGNOSIS — Z1231 Encounter for screening mammogram for malignant neoplasm of breast: Secondary | ICD-10-CM

## 2022-02-10 ENCOUNTER — Ambulatory Visit
Admission: RE | Admit: 2022-02-10 | Discharge: 2022-02-10 | Disposition: A | Discharge status: Home / Self Care | Payer: BC Managed Care – PPO | Source: Ambulatory Visit | Attending: Internal Medicine | Admitting: Internal Medicine

## 2022-02-10 DIAGNOSIS — Z1231 Encounter for screening mammogram for malignant neoplasm of breast: Secondary | ICD-10-CM

## 2022-02-21 IMAGING — MG MM DIGITAL SCREENING BILAT W/ TOMO AND CAD
8 series · 9 of 24 positions shown · non-contrast
Comparison: Previous exam(s).

CLINICAL DATA: Screening.

EXAM:
DIGITAL SCREENING BILATERAL MAMMOGRAM WITH TOMOSYNTHESIS AND CAD
TECHNIQUE: Bilateral screening digital craniocaudal and mediolateral oblique
mammograms were obtained. Bilateral screening digital breast
tomosynthesis was performed. The images were evaluated with
computer-aided detection.

[L CC synth-2D]
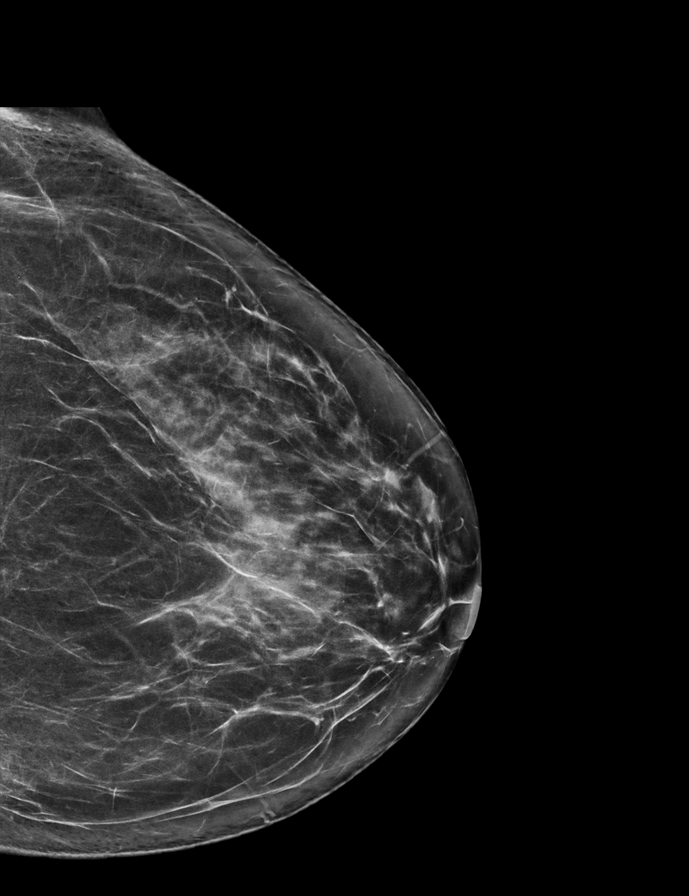

[L MLO synth-2D]
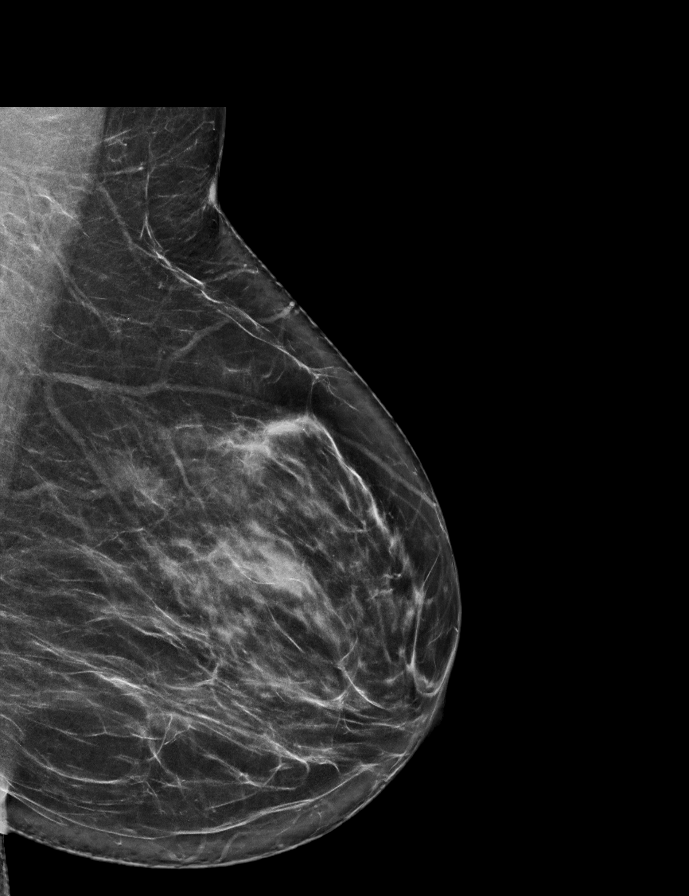

[R MLO synth-2D]
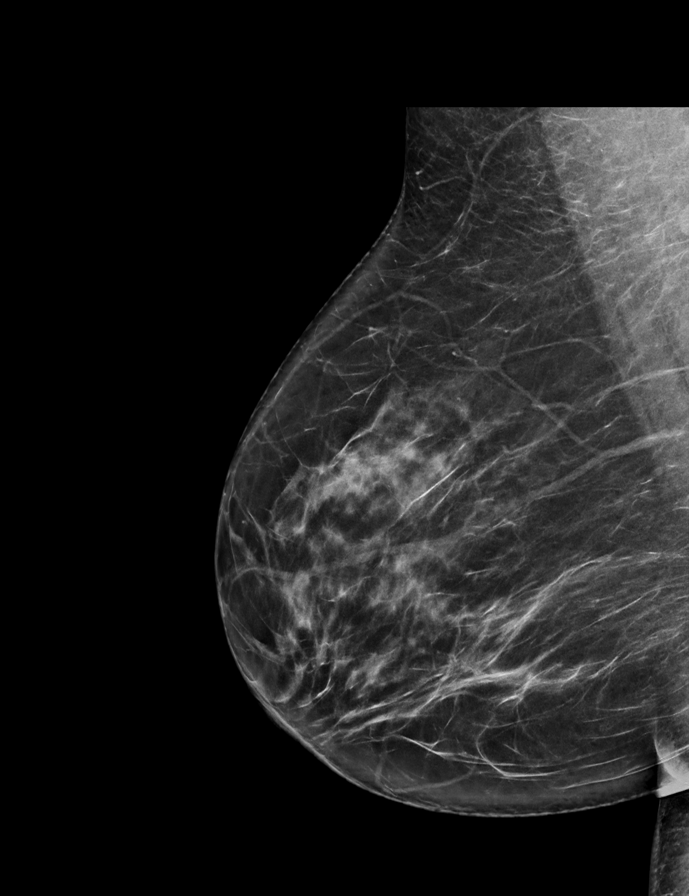

[R CC synth-2D]
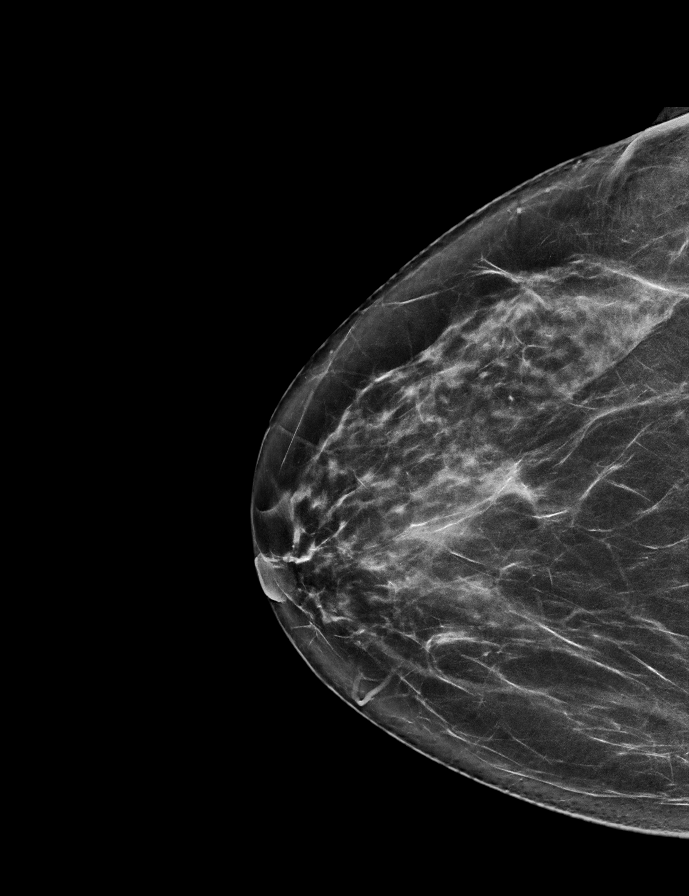

[L CC tomo · 2 of 69 frames shown]
[frame 23/69]
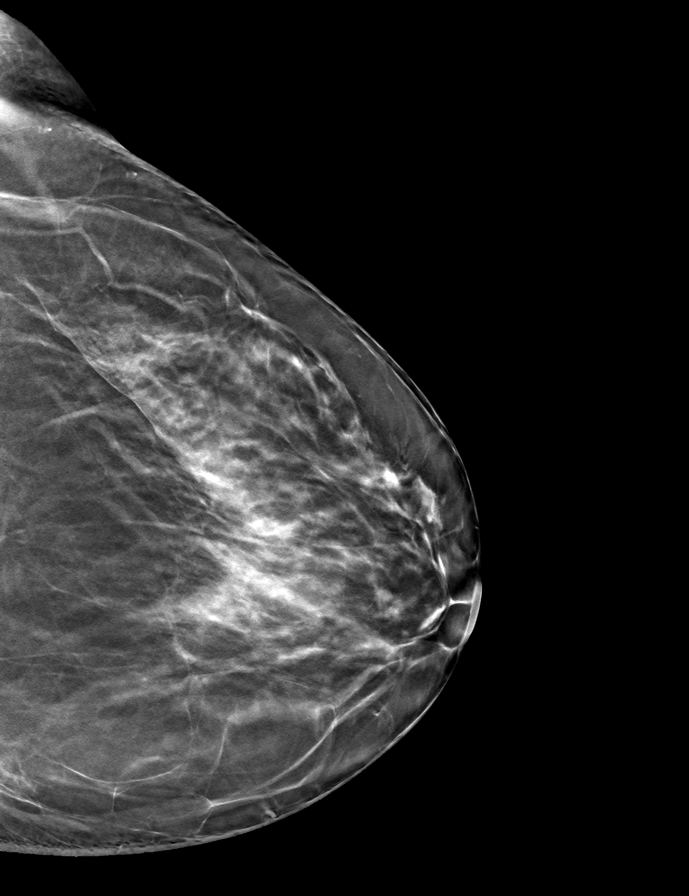
[frame 35/69]
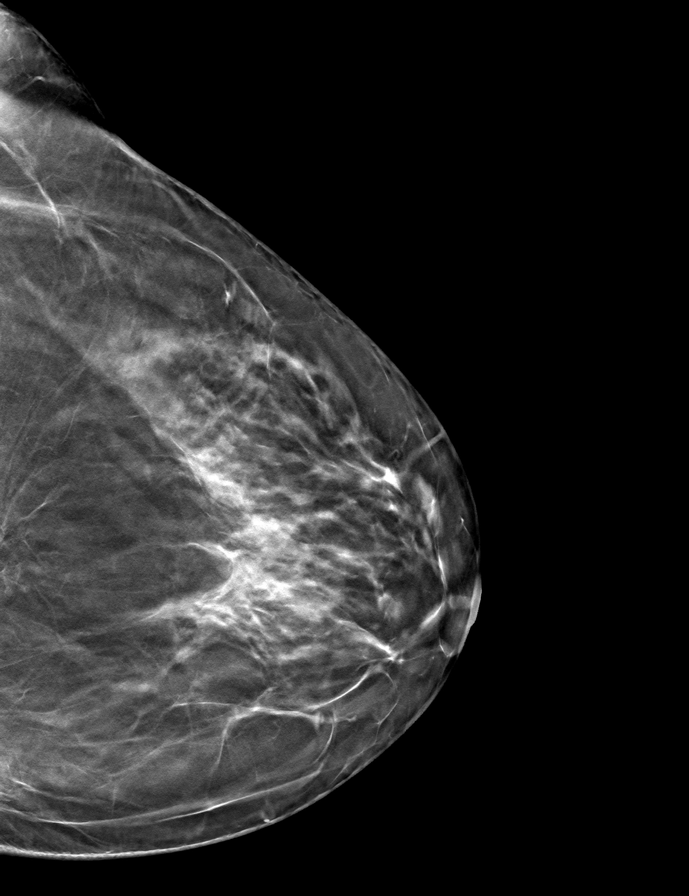

[R CC tomo · tomo slice 33/66.0]
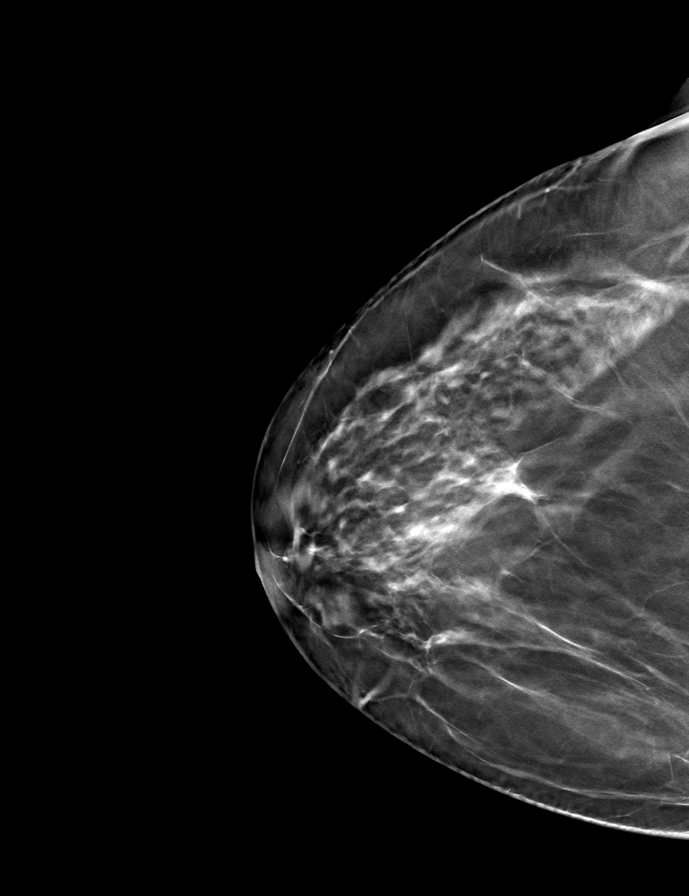

[L MLO tomo · tomo slice 37/72.0]
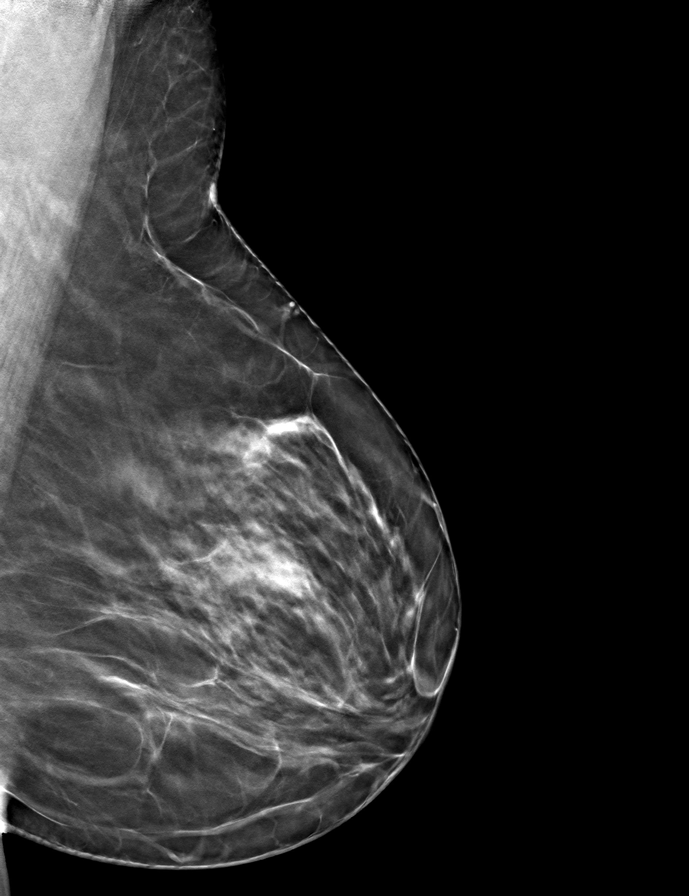

[R MLO tomo · tomo slice 36/71.0]
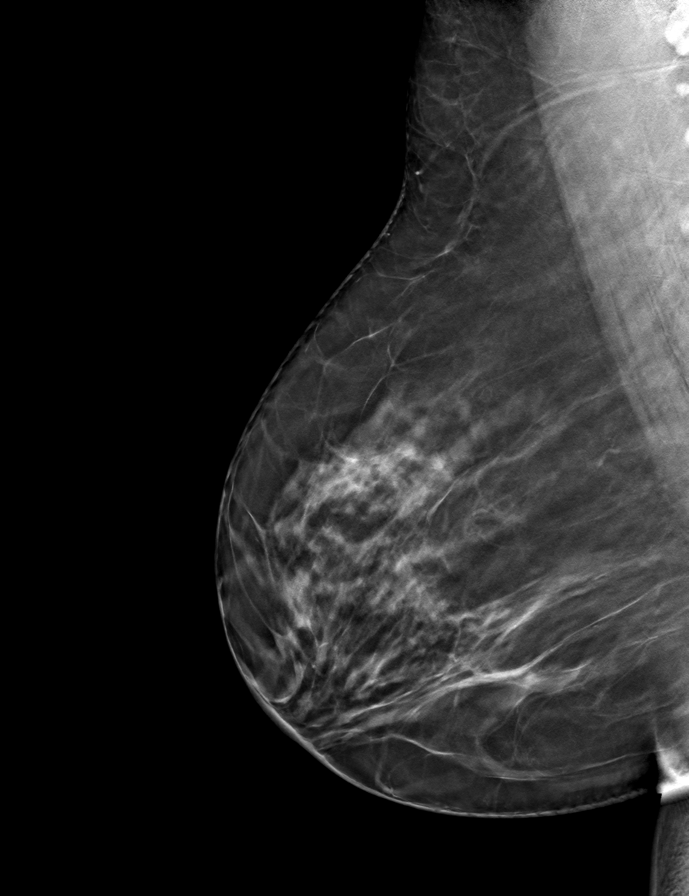

[9 of 24 positions shown; findings below may reference images not displayed]

ACR Breast Density Category b: There are scattered areas of
fibroglandular density.
FINDINGS: There are no findings suspicious for malignancy.
IMPRESSION: No mammographic evidence of malignancy. A result letter of this
screening mammogram will be mailed directly to the patient.

RECOMMENDATION:
Screening mammogram in one year. (Code:51-O-LD2)

BI-RADS CATEGORY  1: Negative.

## 2022-02-22 ENCOUNTER — Encounter: Payer: Self-pay | Admitting: Internal Medicine

## 2022-06-04 LAB — HM COLONOSCOPY

## 2022-06-16 ENCOUNTER — Encounter: Payer: Self-pay | Admitting: Internal Medicine

## 2022-08-11 ENCOUNTER — Encounter: Payer: BC Managed Care – PPO | Admitting: Internal Medicine

## 2022-08-16 ENCOUNTER — Encounter: Payer: Self-pay | Admitting: Internal Medicine

## 2022-08-16 ENCOUNTER — Ambulatory Visit (INDEPENDENT_AMBULATORY_CARE_PROVIDER_SITE_OTHER): Payer: BC Managed Care – PPO | Admitting: Internal Medicine

## 2022-08-16 VITALS — BP 110/78 | HR 61 | Temp 97.7°F | Ht 60.0 in | Wt 135.4 lb

## 2022-08-16 DIAGNOSIS — Z23 Encounter for immunization: Secondary | ICD-10-CM

## 2022-08-16 DIAGNOSIS — Z2821 Immunization not carried out because of patient refusal: Secondary | ICD-10-CM | POA: Diagnosis not present

## 2022-08-16 DIAGNOSIS — Z6826 Body mass index (BMI) 26.0-26.9, adult: Secondary | ICD-10-CM | POA: Diagnosis not present

## 2022-08-16 DIAGNOSIS — E559 Vitamin D deficiency, unspecified: Secondary | ICD-10-CM

## 2022-08-16 DIAGNOSIS — Z0000 Encounter for general adult medical examination without abnormal findings: Secondary | ICD-10-CM | POA: Diagnosis not present

## 2022-08-16 NOTE — Progress Notes (Signed)
I,Mallory Hughes,acting as a scribe for Mallory Greenland, MD.,have documented all relevant documentation on the behalf of Mallory Greenland, MD,as directed by  Mallory Greenland, MD while in the presence of Mallory Greenland, MD.   Subjective:     Patient ID: Mallory Hughes , female    DOB: Feb 02, 1961 , 62 y.o.   MRN: [id number]   Chief Complaint  Patient presents with   Annual Exam    HPI  She is here today for a full physical examination. She is followed by Dr. Cletis Media for her GYN exams.  She was last seen August 2023. She has no specific concerns or complaints. She is accompanied by her husband today.      Past Medical History:  Diagnosis Date   Endometrial cancer ([place])      Family History  Problem Relation Age of Onset   Healthy Mother    Healthy Father      Current Outpatient Medications:    cholecalciferol (VITAMIN D) 1000 units tablet, Take 1,000 Units by mouth daily. Patient is taking 2 tabs daily.  Patient takes Mon-Fri 2000 Units, Disp: , Rfl:    Multiple Vitamin (MULTIVITAMIN) capsule, Take 1 capsule by mouth daily., Disp: , Rfl:    No Known Allergies    The patient states she uses status post hysterectomy for birth control. Last LMP was Patient's last menstrual period was 01/10/2016 (approximate).. Negative for Dysmenorrhea. Negative for: breast discharge, breast lump(s), breast pain and breast self exam. Associated symptoms include abnormal vaginal bleeding. Pertinent negatives include abnormal bleeding (hematology), anxiety, decreased libido, depression, difficulty falling sleep, dyspareunia, history of infertility, nocturia, sexual dysfunction, sleep disturbances, urinary incontinence, urinary urgency, vaginal discharge and vaginal itching. Diet regular.The patient states her exercise level is  moderate.   . The patient's tobacco use is:  Social History   Tobacco Use  Smoking Status Never  Smokeless Tobacco Never  . She has been exposed to passive smoke.  The patient's alcohol use is:  Social History   Substance and Sexual Activity  Alcohol Use No    Review of Systems  Constitutional: Negative.   HENT: Negative.    Eyes: Negative.   Respiratory: Negative.    Cardiovascular: Negative.   Gastrointestinal: Negative.   Endocrine: Negative.   Genitourinary: Negative.   Musculoskeletal: Negative.   Skin: Negative.   Allergic/Immunologic: Negative.   Neurological: Negative.   Hematological: Negative.   Psychiatric/Behavioral: Negative.       Today's Vitals   08/16/22 0829  BP: 110/78  Pulse: 61  Temp: 97.7 F (36.5 C)  SpO2: 98%  Weight: 135 lb 6.4 oz (61.4 kg)  Height: 5' (1.524 m)   Body mass index is 26.44 kg/m.  Wt Readings from Last 3 Encounters:  08/16/22 135 lb 6.4 oz (61.4 kg)  08/18/21 136 lb 6.4 oz (61.9 kg)  07/29/21 136 lb 12.8 oz (62.1 kg)    Objective:  Physical Exam Vitals and nursing note reviewed.  Constitutional:      Appearance: Normal appearance.  HENT:     Head: Normocephalic and atraumatic.     Right Ear: Tympanic membrane, ear canal and external ear normal.     Left Ear: Tympanic membrane, ear canal and external ear normal.     Nose:     Comments: Masked     Mouth/Throat:     Comments: Masked  Eyes:     Extraocular Movements: Extraocular movements intact.     Conjunctiva/sclera: Conjunctivae normal.  Pupils: Pupils are equal, round, and reactive to light.  Cardiovascular:     Rate and Rhythm: Normal rate and regular rhythm.     Pulses: Normal pulses.     Heart sounds: Normal heart sounds.  Pulmonary:     Effort: Pulmonary effort is normal.     Breath sounds: Normal breath sounds.  Chest:  Breasts:    Tanner Score is 5.     Right: Normal.     Left: Normal.  Abdominal:     General: Abdomen is flat. Bowel sounds are normal.     Palpations: Abdomen is soft.  Genitourinary:    Comments: deferred Musculoskeletal:        General: Normal range of motion.     Cervical back: Normal  range of motion and neck supple.  Skin:    General: Skin is warm and dry.  Neurological:     General: No focal deficit present.     Mental Status: She is alert and oriented to person, place, and time.  Psychiatric:        Mood and Affect: Mood normal.        Behavior: Behavior normal.      Assessment And Plan:     1. Encounter for general adult medical examination w/o abnormal findings Comments: A full exam was performed. Importance of monthly self breast exams was discussed with the patient.  PATIENT IS ADVISED TO GET 30-45 MINUTES REGULAR EXERCISE NO LESS THAN FOUR TO FIVE DAYS PER WEEK - BOTH WEIGHTBEARING EXERCISES AND AEROBIC ARE RECOMMENDED.  PATIENT IS ADVISED TO FOLLOW A HEALTHY DIET WITH AT LEAST SIX FRUITS/VEGGIES PER DAY, DECREASE INTAKE OF RED MEAT, AND TO INCREASE FISH INTAKE TO TWO DAYS PER WEEK.  MEATS/FISH SHOULD NOT BE FRIED, BAKED OR BROILED IS PREFERABLE.  IT IS ALSO IMPORTANT TO CUT BACK ON YOUR SUGAR INTAKE. PLEASE AVOID ANYTHING WITH ADDED SUGAR, CORN SYRUP OR OTHER SWEETENERS. IF YOU MUST USE A SWEETENER, YOU CAN TRY STEVIA. IT IS ALSO IMPORTANT TO AVOID ARTIFICIALLY SWEETENERS AND DIET BEVERAGES. LASTLY, I SUGGEST WEARING SPF 50 SUNSCREEN ON EXPOSED PARTS AND ESPECIALLY WHEN IN THE DIRECT SUNLIGHT FOR AN EXTENDED PERIOD OF TIME.  PLEASE AVOID FAST FOOD RESTAURANTS AND INCREASE YOUR WATER INTAKE. - CMP14+EGFR - CBC - Hemoglobin A1c - Lipid panel  2. Vitamin D deficiency Comments: I will check a vitamin D level and supplement as needed. - Vitamin D (25 hydroxy)  3. BMI 26.0-26.9,adult Comments: Her weight is stable. She is encouraged to aim for at least 150 minutes of exercise/week.  4. Need for shingles vaccine - Zoster Recombinant (Shingrix )  5. Influenza vaccination declined  Patient was given opportunity to ask questions. Patient verbalized understanding of the plan and was able to repeat key elements of the plan. All questions were answered to their  satisfaction.   I, Mallory Greenland, MD, have reviewed all documentation for this visit. The documentation on 08/16/22 for the exam, diagnosis, procedures, and orders are all accurate and complete.   THE PATIENT IS ENCOURAGED TO PRACTICE SOCIAL DISTANCING DUE TO THE COVID-19 PANDEMIC.

## 2022-08-16 NOTE — Patient Instructions (Addendum)
The 10-year ASCVD risk score (Arnett DK, et al., 2019) is: 2.6%   Values used to calculate the score:     Age: 62 years     Sex: Female     Is Non-Hispanic African American: No     Diabetic: No     Tobacco smoker: No     Systolic Blood Pressure: [id number] mmHg     Is BP treated: No     HDL Cholesterol: 46 mg/dL     Total Cholesterol: 181 mg/dL   Health Maintenance, Female Adopting a healthy lifestyle and getting preventive care are important in promoting health and wellness. Ask your health care provider about: The right schedule for you to have regular tests and exams. Things you can do on your own to prevent diseases and keep yourself healthy. What should I know about diet, weight, and exercise? Eat a healthy diet  Eat a diet that includes plenty of vegetables, fruits, low-fat dairy products, and lean protein. Do not eat a lot of foods that are high in solid fats, added sugars, or sodium. Maintain a healthy weight Body mass index (BMI) is used to identify weight problems. It estimates body fat based on height and weight. Your health care provider can help determine your BMI and help you achieve or maintain a healthy weight. Get regular exercise Get regular exercise. This is one of the most important things you can do for your health. Most adults should: Exercise for at least 150 minutes each week. The exercise should increase your heart rate and make you sweat (moderate-intensity exercise). Do strengthening exercises at least twice a week. This is in addition to the moderate-intensity exercise. Spend less time sitting. Even light physical activity can be beneficial. Watch cholesterol and blood lipids Have your blood tested for lipids and cholesterol at 61 years of age, then have this test every 5 years. Have your cholesterol levels checked more often if: Your lipid or cholesterol levels are high. You are older than 61 years of age. You are at high risk for heart disease. What should I  know about cancer screening? Depending on your health history and family history, you may need to have cancer screening at various ages. This may include screening for: Breast cancer. Cervical cancer. Colorectal cancer. Skin cancer. Lung cancer. What should I know about heart disease, diabetes, and high blood pressure? Blood pressure and heart disease High blood pressure causes heart disease and increases the risk of stroke. This is more likely to develop in people who have high blood pressure readings or are overweight. Have your blood pressure checked: Every 3-5 years if you are 49-65 years of age. Every year if you are 68 years old or older. Diabetes Have regular diabetes screenings. This checks your fasting blood sugar level. Have the screening done: Once every three years after age 82 if you are at a normal weight and have a low risk for diabetes. More often and at a younger age if you are overweight or have a high risk for diabetes. What should I know about preventing infection? Hepatitis B If you have a higher risk for hepatitis B, you should be screened for this virus. Talk with your health care provider to find out if you are at risk for hepatitis B infection. Hepatitis C Testing is recommended for: Everyone born from [birth year] through 1964. Anyone with known risk factors for hepatitis C. Sexually transmitted infections (STIs) Get screened for STIs, including gonorrhea and chlamydia, if: You are sexually active  and are younger than 61 years of age. You are older than 61 years of age and your health care provider tells you that you are at risk for this type of infection. Your sexual activity has changed since you were last screened, and you are at increased risk for chlamydia or gonorrhea. Ask your health care provider if you are at risk. Ask your health care provider about whether you are at high risk for HIV. Your health care provider may recommend a prescription medicine to help  prevent HIV infection. If you choose to take medicine to prevent HIV, you should first get tested for HIV. You should then be tested every 3 months for as long as you are taking the medicine. Pregnancy If you are about to stop having your period (premenopausal) and you may become pregnant, seek counseling before you get pregnant. Take 400 to 800 micrograms (mcg) of folic acid every day if you become pregnant. Ask for birth control (contraception) if you want to prevent pregnancy. Osteoporosis and menopause Osteoporosis is a disease in which the bones lose minerals and strength with aging. This can result in bone fractures. If you are 48 years old or older, or if you are at risk for osteoporosis and fractures, ask your health care provider if you should: Be screened for bone loss. Take a calcium or vitamin D supplement to lower your risk of fractures. Be given hormone replacement therapy (HRT) to treat symptoms of menopause. Follow these instructions at home: Alcohol use Do not drink alcohol if: Your health care provider tells you not to drink. You are pregnant, may be pregnant, or are planning to become pregnant. If you drink alcohol: Limit how much you have to: 0-1 drink a day. Know how much alcohol is in your drink. In the [place] one drink equals one 12 oz bottle of beer (355 mL), one 5 oz glass of wine (148 mL), or one 1 oz glass of hard liquor (44 mL). Lifestyle Do not use any products that contain nicotine or tobacco. These products include cigarettes, chewing tobacco, and vaping devices, such as e-cigarettes. If you need help quitting, ask your health care provider. Do not use street drugs. Do not share needles. Ask your health care provider for help if you need support or information about quitting drugs. General instructions Schedule regular health, dental, and eye exams. Stay current with your vaccines. Tell your health care provider if: You often feel depressed. You have ever  been abused or do not feel safe at home. Summary Adopting a healthy lifestyle and getting preventive care are important in promoting health and wellness. Follow your health care provider's instructions about healthy diet, exercising, and getting tested or screened for diseases. Follow your health care provider's instructions on monitoring your cholesterol and blood pressure. This information is not intended to replace advice given to you by your health care provider. Make sure you discuss any questions you have with your health care provider. Document Revised: 11/10/2020 Document Reviewed: 11/10/2020 Elsevier Patient Education  [place].

## 2022-08-17 LAB — LIPID PANEL
Chol/HDL Ratio: 3.6 ratio (ref 0.0–4.4)
Cholesterol, Total: 164 mg/dL (ref 100–199)
HDL: 45 mg/dL (ref >39)
LDL Chol Calc (NIH): 97 mg/dL (ref 0–99)
Triglycerides: 122 mg/dL (ref 0–149)
VLDL Cholesterol Cal: 22 mg/dL (ref 5–40)

## 2022-08-17 LAB — CMP14+EGFR
ALT: 11 IU/L (ref 0–32)
AST: 13 IU/L (ref 0–40)
Albumin/Globulin Ratio: 1.4 (ref 1.2–2.2)
Albumin: 4.4 g/dL (ref 3.8–4.9)
Alkaline Phosphatase: 71 IU/L (ref 44–121)
BUN/Creatinine Ratio: 10 — ABNORMAL LOW (ref 12–28)
BUN: 7 mg/dL — ABNORMAL LOW (ref 8–27)
Bilirubin Total: 0.3 mg/dL (ref 0.0–1.2)
CO2: 22 mmol/L (ref 20–29)
Calcium: 9.3 mg/dL (ref 8.7–10.3)
Chloride: 98 mmol/L (ref 96–106)
Creatinine, Ser: 0.73 mg/dL (ref 0.57–1.00)
Globulin, Total: 3.1 g/dL (ref 1.5–4.5)
Glucose: 103 mg/dL — ABNORMAL HIGH (ref 70–99)
Potassium: 4.1 mmol/L (ref 3.5–5.2)
Sodium: 137 mmol/L (ref 134–144)
Total Protein: 7.5 g/dL (ref 6.0–8.5)
eGFR: 94 mL/min/{1.73_m2} (ref >59)

## 2022-08-17 LAB — CBC
Hematocrit: 40.1 % (ref 34.0–46.6)
Hemoglobin: 13.5 g/dL (ref 11.1–15.9)
MCH: 29.4 pg (ref 26.6–33.0)
MCHC: 33.7 g/dL (ref 31.5–35.7)
MCV: 87 fL (ref 79–97)
Platelets: 234 10*3/uL (ref 150–450)
RBC: 4.59 x10E6/uL (ref 3.77–5.28)
RDW: 11.7 % (ref 11.7–15.4)
WBC: 6.9 10*3/uL (ref 3.4–10.8)

## 2022-08-17 LAB — HEMOGLOBIN A1C
Est. average glucose Bld gHb Est-mCnc: 120 mg/dL
Hgb A1c MFr Bld: 5.8 % — ABNORMAL HIGH (ref 4.8–5.6)

## 2022-08-17 LAB — VITAMIN D 25 HYDROXY (VIT D DEFICIENCY, FRACTURES): Vit D, 25-Hydroxy: 37.8 ng/mL (ref 30.0–100.0)

## 2023-01-12 ENCOUNTER — Other Ambulatory Visit: Payer: Self-pay | Admitting: Internal Medicine

## 2023-01-12 DIAGNOSIS — Z1231 Encounter for screening mammogram for malignant neoplasm of breast: Secondary | ICD-10-CM

## 2023-02-14 ENCOUNTER — Ambulatory Visit
Admission: RE | Admit: 2023-02-14 | Discharge: 2023-02-14 | Disposition: A | Discharge status: Home / Self Care | Payer: BC Managed Care – PPO | Source: Ambulatory Visit | Attending: Internal Medicine | Admitting: Internal Medicine

## 2023-02-14 DIAGNOSIS — Z1231 Encounter for screening mammogram for malignant neoplasm of breast: Secondary | ICD-10-CM

## 2023-08-29 ENCOUNTER — Encounter: Payer: Self-pay | Admitting: Internal Medicine

## 2023-08-29 ENCOUNTER — Ambulatory Visit (INDEPENDENT_AMBULATORY_CARE_PROVIDER_SITE_OTHER): Payer: BC Managed Care – PPO | Admitting: Internal Medicine

## 2023-08-29 VITALS — BP 122/70 | HR 65 | Temp 97.6°F | Ht 60.0 in | Wt 134.6 lb

## 2023-08-29 DIAGNOSIS — Z0000 Encounter for general adult medical examination without abnormal findings: Secondary | ICD-10-CM | POA: Insufficient documentation

## 2023-08-29 DIAGNOSIS — E559 Vitamin D deficiency, unspecified: Secondary | ICD-10-CM | POA: Diagnosis not present

## 2023-08-29 NOTE — Assessment & Plan Note (Addendum)
 A full exam was performed.  Importance of monthly self breast exams was discussed with the patient.  I will request her most recent pap smear/notes from Dr. Estanislado Pandy.  She is advised to get 30-45 minutes of regular exercise, no less than four to five days per week. Both weight-bearing and aerobic exercises are recommended.  She is advised to follow a healthy diet with at least six fruits/veggies per day, decrease intake of red meat and other saturated fats and to increase fish intake to twice weekly.  Meats/fish should not be fried -- baked, boiled or broiled is preferable. It is also important to cut back on your sugar intake.  Be sure to read labels - try to avoid anything with added sugar, high fructose corn syrup or other sweeteners.  If you must use a sweetener, you can try stevia or monkfruit.  It is also important to avoid artificially sweetened foods/beverages and diet drinks. Lastly, wear SPF 50 sunscreen on exposed skin and when in direct sunlight for an extended period of time.  Be sure to avoid fast food restaurants and aim for at least 60 ounces of water daily.

## 2023-08-29 NOTE — Patient Instructions (Signed)

## 2023-08-29 NOTE — Progress Notes (Signed)
 I,Victoria T Deloria Lair, CMA,acting as a Neurosurgeon for Gwynneth Aliment, MD.,have documented all relevant documentation on the behalf of Gwynneth Aliment, MD,as directed by  Gwynneth Aliment, MD while in the presence of Gwynneth Aliment, MD.  Subjective:    Patient ID: Mallory Hughes , female    DOB: 10-03-1960 , 63 y.o.   MRN: [id number]  Chief Complaint  Patient presents with   Annual Exam    HPI  She is here today for a full physical examination. She is followed by Dr. Estanislado Pandy for her GYN exams.  She is s/p hysterectomy due to endometrial cancer. She has no specific concerns or complaints. She is accompanied by her husband today. She has been exercising daily.      Past Medical History:  Diagnosis Date   Endometrial cancer (HCC)      Family History  Problem Relation Age of Onset   Healthy Mother    Healthy Father      Current Outpatient Medications:    cholecalciferol (VITAMIN D) 1000 units tablet, Take 1,000 Units by mouth daily. Patient is taking 2 tabs daily.  Patient takes Mon-Fri 2000 Units, Disp: , Rfl:    Multiple Vitamin (MULTIVITAMIN) capsule, Take 1 capsule by mouth daily., Disp: , Rfl:    No Known Allergies    The patient states she uses status post hysterectomy for birth control. Patient's last menstrual period was 01/10/2016 (approximate).. Negative for Dysmenorrhea. Negative for: breast discharge, breast lump(s), breast pain and breast self exam. Associated symptoms include abnormal vaginal bleeding. Pertinent negatives include abnormal bleeding (hematology), anxiety, decreased libido, depression, difficulty falling sleep, dyspareunia, history of infertility, nocturia, sexual dysfunction, sleep disturbances, urinary incontinence, urinary urgency, vaginal discharge and vaginal itching. Diet regular.The patient states her exercise level is    . The patient's tobacco use is:  Social History   Tobacco Use  Smoking Status Never  Smokeless Tobacco Never  . She has been  exposed to passive smoke. The patient's alcohol use is:  Social History   Substance and Sexual Activity  Alcohol Use No      Review of Systems  Constitutional: Negative.   HENT: Negative.    Eyes: Negative.   Respiratory: Negative.    Cardiovascular: Negative.   Gastrointestinal: Negative.   Endocrine: Negative.   Genitourinary: Negative.   Musculoskeletal: Negative.   Skin: Negative.   Allergic/Immunologic: Negative.   Neurological: Negative.   Hematological: Negative.   Psychiatric/Behavioral: Negative.       Today's Vitals   08/29/23 0845  BP: 122/70  Pulse: 65  Temp: 97.6 F (36.4 C)  SpO2: 98%  Weight: 134 lb 9.6 oz (61.1 kg)  Height: 5' (1.524 m)   Body mass index is 26.29 kg/m.  Wt Readings from Last 3 Encounters:  08/29/23 134 lb 9.6 oz (61.1 kg)  08/16/22 135 lb 6.4 oz (61.4 kg)  08/18/21 136 lb 6.4 oz (61.9 kg)     Objective:  Physical Exam Vitals and nursing note reviewed.  Constitutional:      Appearance: Normal appearance.  HENT:     Head: Normocephalic and atraumatic.     Right Ear: Tympanic membrane, ear canal and external ear normal.     Left Ear: Tympanic membrane, ear canal and external ear normal.     Nose: Nose normal.     Mouth/Throat:     Mouth: Mucous membranes are moist.     Pharynx: Oropharynx is clear.  Eyes:     Extraocular Movements: Extraocular  movements intact.     Conjunctiva/sclera: Conjunctivae normal.     Pupils: Pupils are equal, round, and reactive to light.  Cardiovascular:     Rate and Rhythm: Normal rate and regular rhythm.     Pulses: Normal pulses.     Heart sounds: Normal heart sounds.  Pulmonary:     Effort: Pulmonary effort is normal.     Breath sounds: Normal breath sounds.  Abdominal:     General: Abdomen is flat. Bowel sounds are normal.     Palpations: Abdomen is soft.  Genitourinary:    Comments: deferred Musculoskeletal:        General: Normal range of motion.     Cervical back: Normal range of  motion and neck supple.  Skin:    General: Skin is warm and dry.  Neurological:     General: No focal deficit present.     Mental Status: She is alert and oriented to person, place, and time.  Psychiatric:        Mood and Affect: Mood normal.        Behavior: Behavior normal.         Assessment And Plan:     Encounter for general adult medical examination w/o abnormal findings Assessment & Plan: A full exam was performed.  Importance of monthly self breast exams was discussed with the patient.  I will request her most recent pap smear/notes from Dr. Estanislado Pandy.  She is advised to get 30-45 minutes of regular exercise, no less than four to five days per week. Both weight-bearing and aerobic exercises are recommended.  She is advised to follow a healthy diet with at least six fruits/veggies per day, decrease intake of red meat and other saturated fats and to increase fish intake to twice weekly.  Meats/fish should not be fried -- baked, boiled or broiled is preferable. It is also important to cut back on your sugar intake.  Be sure to read labels - try to avoid anything with added sugar, high fructose corn syrup or other sweeteners.  If you must use a sweetener, you can try stevia or monkfruit.  It is also important to avoid artificially sweetened foods/beverages and diet drinks. Lastly, wear SPF 50 sunscreen on exposed skin and when in direct sunlight for an extended period of time.  Be sure to avoid fast food restaurants and aim for at least 60 ounces of water daily.      Orders: -     CBC -     CMP14+EGFR -     Lipid panel -     Hemoglobin A1c -     TSH  Vitamin D deficiency -     VITAMIN D 25 Hydroxy (Vit-D Deficiency, Fractures)     Return for 1 year HM, . Patient was given opportunity to ask questions. Patient verbalized understanding of the plan and was able to repeat key elements of the plan. All questions were answered to their satisfaction.   I, Gwynneth Aliment, MD, have  reviewed all documentation for this visit. The documentation on 08/29/23 for the exam, diagnosis, procedures, and orders are all accurate and complete.

## 2023-08-30 LAB — CBC
Hematocrit: 38.9 % (ref 34.0–46.6)
Hemoglobin: 13.3 g/dL (ref 11.1–15.9)
MCH: 30.3 pg (ref 26.6–33.0)
MCHC: 34.2 g/dL (ref 31.5–35.7)
MCV: 89 fL (ref 79–97)
Platelets: 234 10*3/uL (ref 150–450)
RBC: 4.39 x10E6/uL (ref 3.77–5.28)
RDW: 12 % (ref 11.7–15.4)
WBC: 6.3 10*3/uL (ref 3.4–10.8)

## 2023-08-30 LAB — CMP14+EGFR
ALT: 10 IU/L (ref 0–32)
AST: 12 IU/L (ref 0–40)
Albumin: 4.4 g/dL (ref 3.9–4.9)
Alkaline Phosphatase: 78 IU/L (ref 44–121)
BUN/Creatinine Ratio: 10 — ABNORMAL LOW (ref 12–28)
BUN: 7 mg/dL — ABNORMAL LOW (ref 8–27)
Bilirubin Total: 0.4 mg/dL (ref 0.0–1.2)
CO2: 25 mmol/L (ref 20–29)
Calcium: 9.4 mg/dL (ref 8.7–10.3)
Chloride: 98 mmol/L (ref 96–106)
Creatinine, Ser: 0.7 mg/dL (ref 0.57–1.00)
Globulin, Total: 2.9 g/dL (ref 1.5–4.5)
Glucose: 96 mg/dL (ref 70–99)
Potassium: 4.1 mmol/L (ref 3.5–5.2)
Sodium: 134 mmol/L (ref 134–144)
Total Protein: 7.3 g/dL (ref 6.0–8.5)
eGFR: 98 mL/min/{1.73_m2} (ref >59)

## 2023-08-30 LAB — LIPID PANEL
Chol/HDL Ratio: 3.7 ratio (ref 0.0–4.4)
Cholesterol, Total: 172 mg/dL (ref 100–199)
HDL: 47 mg/dL (ref >39)
LDL Chol Calc (NIH): 108 mg/dL — ABNORMAL HIGH (ref 0–99)
Triglycerides: 90 mg/dL (ref 0–149)
VLDL Cholesterol Cal: 17 mg/dL (ref 5–40)

## 2023-08-30 LAB — VITAMIN D 25 HYDROXY (VIT D DEFICIENCY, FRACTURES): Vit D, 25-Hydroxy: 34.4 ng/mL (ref 30.0–100.0)

## 2023-08-30 LAB — HEMOGLOBIN A1C
Est. average glucose Bld gHb Est-mCnc: 117 mg/dL
Hgb A1c MFr Bld: 5.7 % — ABNORMAL HIGH (ref 4.8–5.6)

## 2023-08-30 LAB — TSH: TSH: 2.35 u[IU]/mL (ref 0.450–4.500)

## 2023-09-04 ENCOUNTER — Encounter: Payer: Self-pay | Admitting: Internal Medicine

## 2023-10-10 ENCOUNTER — Encounter: Payer: Self-pay | Admitting: Internal Medicine

## 2023-12-21 ENCOUNTER — Other Ambulatory Visit: Payer: Self-pay | Admitting: Obstetrics and Gynecology

## 2023-12-21 DIAGNOSIS — Z1231 Encounter for screening mammogram for malignant neoplasm of breast: Secondary | ICD-10-CM

## 2024-02-15 ENCOUNTER — Ambulatory Visit
Admission: RE | Admit: 2024-02-15 | Discharge: 2024-02-15 | Disposition: A | Discharge status: Home / Self Care | Source: Ambulatory Visit | Attending: Obstetrics and Gynecology | Admitting: Obstetrics and Gynecology

## 2024-02-15 DIAGNOSIS — Z1231 Encounter for screening mammogram for malignant neoplasm of breast: Secondary | ICD-10-CM

## 2024-09-04 ENCOUNTER — Encounter: Payer: BC Managed Care – PPO | Admitting: Internal Medicine

## 2024-09-10 ENCOUNTER — Encounter: Payer: BC Managed Care – PPO | Admitting: Internal Medicine
